# Patient Record
Sex: Female | Born: 1965 | Race: Black or African American | Hispanic: No | Marital: Married | Smoking: Never smoker
Health system: Southern US, Community
[De-identification: ages and names within clinical notes are randomized; demographics above are authoritative.]

## PROBLEM LIST (undated history)

## (undated) DIAGNOSIS — Z5189 Encounter for other specified aftercare: Secondary | ICD-10-CM

## (undated) DIAGNOSIS — D219 Benign neoplasm of connective and other soft tissue, unspecified: Secondary | ICD-10-CM

## (undated) DIAGNOSIS — D649 Anemia, unspecified: Secondary | ICD-10-CM

## (undated) DIAGNOSIS — N3010 Interstitial cystitis (chronic) without hematuria: Secondary | ICD-10-CM

## (undated) DIAGNOSIS — T7840XA Allergy, unspecified, initial encounter: Secondary | ICD-10-CM

## (undated) HISTORY — PX: UTERINE FIBROID SURGERY: SHX826

## (undated) HISTORY — PX: MYOMECTOMY: SHX85

## (undated) HISTORY — DX: Encounter for other specified aftercare: Z51.89

## (undated) HISTORY — DX: Allergy, unspecified, initial encounter: T78.40XA

---

## 1998-08-06 ENCOUNTER — Encounter: Admission: RE | Admit: 1998-08-06 | Discharge: 1998-08-06 | Payer: Self-pay | Admitting: *Deleted

## 1999-10-24 ENCOUNTER — Encounter: Payer: Self-pay | Admitting: Obstetrics & Gynecology

## 1999-10-24 ENCOUNTER — Inpatient Hospital Stay (HOSPITAL_COMMUNITY): Admission: AD | Admit: 1999-10-24 | Discharge: 1999-10-24 | Payer: Self-pay | Admitting: Obstetrics & Gynecology

## 1999-11-13 ENCOUNTER — Encounter: Admission: RE | Admit: 1999-11-13 | Discharge: 1999-11-13 | Payer: Self-pay | Admitting: Obstetrics

## 2000-05-06 ENCOUNTER — Encounter: Admission: RE | Admit: 2000-05-06 | Discharge: 2000-05-06 | Payer: Self-pay | Admitting: Obstetrics

## 2000-12-23 ENCOUNTER — Encounter: Admission: RE | Admit: 2000-12-23 | Discharge: 2000-12-23 | Payer: Self-pay | Admitting: Obstetrics

## 2001-02-03 ENCOUNTER — Encounter: Admission: RE | Admit: 2001-02-03 | Discharge: 2001-02-03 | Payer: Self-pay | Admitting: Obstetrics

## 2001-06-21 ENCOUNTER — Encounter: Payer: Self-pay | Admitting: *Deleted

## 2001-06-21 ENCOUNTER — Inpatient Hospital Stay (HOSPITAL_COMMUNITY): Admission: AD | Admit: 2001-06-21 | Discharge: 2001-06-21 | Payer: Self-pay | Admitting: *Deleted

## 2001-12-13 ENCOUNTER — Encounter: Admission: RE | Admit: 2001-12-13 | Discharge: 2001-12-13 | Payer: Self-pay | Admitting: Obstetrics

## 2003-05-17 ENCOUNTER — Ambulatory Visit (HOSPITAL_COMMUNITY): Admission: RE | Admit: 2003-05-17 | Discharge: 2003-05-17 | Payer: Self-pay | Admitting: Obstetrics & Gynecology

## 2003-05-17 ENCOUNTER — Encounter: Payer: Self-pay | Admitting: Obstetrics & Gynecology

## 2003-07-31 ENCOUNTER — Ambulatory Visit (HOSPITAL_COMMUNITY): Admission: RE | Admit: 2003-07-31 | Discharge: 2003-07-31 | Payer: Self-pay | Admitting: Obstetrics & Gynecology

## 2003-07-31 ENCOUNTER — Encounter: Payer: Self-pay | Admitting: Obstetrics & Gynecology

## 2004-02-27 ENCOUNTER — Other Ambulatory Visit: Admission: RE | Admit: 2004-02-27 | Discharge: 2004-02-27 | Payer: Self-pay | Admitting: Gynecology

## 2004-07-31 ENCOUNTER — Encounter (INDEPENDENT_AMBULATORY_CARE_PROVIDER_SITE_OTHER): Payer: Self-pay | Admitting: Specialist

## 2004-07-31 ENCOUNTER — Observation Stay (HOSPITAL_COMMUNITY): Admission: RE | Admit: 2004-07-31 | Discharge: 2004-08-01 | Payer: Self-pay | Admitting: Gynecology

## 2005-04-09 ENCOUNTER — Other Ambulatory Visit: Admission: RE | Admit: 2005-04-09 | Discharge: 2005-04-09 | Payer: Self-pay | Admitting: Gynecology

## 2005-05-19 ENCOUNTER — Other Ambulatory Visit: Admission: RE | Admit: 2005-05-19 | Discharge: 2005-05-19 | Payer: Self-pay | Admitting: Gynecology

## 2006-04-08 ENCOUNTER — Emergency Department (HOSPITAL_COMMUNITY): Admission: EM | Admit: 2006-04-08 | Discharge: 2006-04-09 | Payer: Self-pay | Admitting: Emergency Medicine

## 2007-08-23 ENCOUNTER — Encounter: Admission: RE | Admit: 2007-08-23 | Discharge: 2007-08-23 | Payer: Self-pay | Admitting: Family Medicine

## 2008-01-11 ENCOUNTER — Other Ambulatory Visit: Admission: RE | Admit: 2008-01-11 | Discharge: 2008-01-11 | Payer: Self-pay | Admitting: Gynecology

## 2011-04-10 NOTE — Op Note (Signed)
NAME:  Teresa Preston, Teresa Preston                          ACCOUNT NO.:  [id number]   MEDICAL RECORD NO.:  [id number]                   PATIENT TYPE:  OBV   LOCATION:  0098                                 FACILITY:  [facility]   PHYSICIAN:  Leatha Gilding. Mezer, M.D.               DATE OF BIRTH:  26-Aug-1966   DATE OF PROCEDURE:  07/31/2004  DATE OF DISCHARGE:                                 OPERATIVE REPORT   PREOPERATIVE DIAGNOSES:  Fibroid uterus.   POSTOPERATIVE DIAGNOSES:  Fibroid uterus.   OPERATION PERFORMED:  Abdominal myomectomy.   SURGEON:  Leatha Gilding. Mezer, M.D.   ASSISTANT:  Almedia Balls. Randell Patient, M.D.   ANESTHESIA:  General endotracheal.   PREPARATION:  Betadine.   DESCRIPTION OF PROCEDURE:  With the patient in the supine position, she was  prepped and draped in the routine fashion.  A Pfannenstiel incision was made  through the skin and subcutaneous tissue. The fascia and peritoneum were  opened without difficulty. Exploration of her upper abdomen was benign.  The  appendix was normal. Exploration of the pelvis revealed the uterus to be 14-  16 weeks in size with a large bulge on the posterior aspect of the uterus.  The ovaries were normal and both tubes were occluded distally. There was a  complete occlusion of the distal tubes and the scarring appeared to be quite  chronic but not salvageable. The uterus was elevated. A dilute solution of  Pitressin was injected in the posterior wall of the uterus and the  myometrium incised. The fibroid was quite deep in the myometrium and the  dissection plans were terrible.  Very meticulous dissection was performed in  particular on the bottom half of the equator of the fibroid.  Even though  the dissection was taken as close to the fibroid as possible with very good  traction and counter traction, there was still a significant defect made in  the wall of the endometrium.  The pediatric Foley that had been placed was  deflated and removed.  The  myometrium was closed in multiple layers using #0  chromic suture taking care not to enter the endometrium with the sutures and  trying to restore the cavity as much as possible. The myometrium was built  up very nicely with multiple layers and the serosa was approximated with a  baseball stitch of 3-0 PDS.  Hemostasis was completely intact.  The pelvis  was irrigated with copious amounts of warm lactated Ringer's solution, the  pelvis reinspected, the suture line again found to be completely dry. At the  completion of the procedure, an effort was made to place the large bowel in  the cul-de-sac, the omentum was brought down, the abdomen was closed in  layers using a running 2-0 Vicryl on the peritoneum, running #0 Vicryl at  the midline bilaterally on the fascia. Hemostasis was assured in the  subcutaneous tissue and the  skin was closed with staples. The estimated  blood loss was 200 mL.  Sponge, needle and instrument counts were correct  x2.  The patient tolerated the procedure well and was taken to the recovery  room in satisfactory condition.                                              Leatha Gilding. Mezer, M.D.   HCM/MEDQ  D:  07/31/2004  T:  07/31/2004  Job:  [id number]

## 2011-04-10 NOTE — H&P (Signed)
NAME:  Teresa Preston, Teresa Preston                          ACCOUNT NO.:  [id number]   MEDICAL RECORD NO.:  [id number]                   PATIENT TYPE:  AMB   LOCATION:  DAY                                  FACILITY:  [facility]   PHYSICIAN:  Howard C. Mezer, M.D.               DATE OF BIRTH:  05/07/1966   DATE OF ADMISSION:  07/31/2004  DATE OF DISCHARGE:                                HISTORY & PHYSICAL   ADMISSION DIAGNOSIS:  Fibroid uterus.   The patient is a 45 year old gravida 1, AB 1, female, admitted with last  menstrual period on July 22, 2004, with a fibroid uterus, for abdominal  myomectomy.  The patient is attempting to achieve pregnancy and on  hysterosalpingogram had a distorted uterine cavity with a small fill of the  right tube and no fill of the left tube.  On examination there appears to be  a large fibroid with the uterus extending almost to the umbilicus.  The  options have been discussed in detail with the patient, including no surgery  and possible adoption and myomectomy.  Limitations for becoming pregnant  after myomectomy have been stressed on multiple visits, as we have no  assessment of the fallopian tubes and the potential for adhesions at this  time or postoperative adhesions secondary to the myomectomy.  Myomectomy has  been discussed in detail with the patient and her husband, and potential  complications including but not limited to anesthesia; injury to the bowel,  bladder, and ureters; possible blood loss with transfusion and its sequelae;  and possible infection in the pelvis or in the wound; the possible need for  hysterectomy at the time of surgery if there was excessive bleeding or not  enough uterus to properly reconstruct the uterus after the myomectomy.  Absolutely no guarantee has been given to become pregnant or the status of  the fallopian tubes.  The patient understands that almost for sure there  will be a need for cesarean section should she become  pregnant and a  potential for uterine rupture that could be catastrophic for the baby and/or  the mother.  Possible postoperative adhesions and their consequences have  been discussed.  Postoperative expectations and restrictions have been  reviewed in detail.  The patient understands that she needs to use perfect  contraception for six months postoperatively.  Pain control has been  discussed.  The possible need for a transfusion has been stressed, given the  size of the fibroid and low hemoglobin before the procedure.  The patient  has asked very extensive questions and appears to have a very good  understanding of the procedure and the possible alternatives.   PAST MEDICAL HISTORY:  Surgical:  D&C.   Medical:  None.   MEDICATIONS:  Iron and vitamins, no herbs or supplements.   ALLERGIES:  BACTRIM.   Smokes:  None.  ETOH:  None.   SOCIAL HISTORY:  The patient is married and works for [employer].   FAMILY HISTORY:  Positive for colon cancer and uterine cancer.   PHYSICAL EXAMINATION:  HEENT:  Negative.  CHEST:  The lungs are clear.  CARDIAC:  Without murmurs.  BREASTS:  Without masses or discharge.  ABDOMEN:  Obese, soft, and nontender, with a mass below the umbilicus.  PELVIC:  Vagina and cervix normal.  The uterus palpates to just below the  umbilicus.  The adnexa were without palpable masses.  EXTREMITIES:  Negative.   IMPRESSION:  1.  Fibroid uterus.  2.  Obesity.   PLAN:  Abdominal myomectomy                                               Howard C. Mezer, M.D.    HCM/MEDQ  D:  07/31/2004  T:  07/31/2004  Job:  [id number]

## 2011-10-16 ENCOUNTER — Other Ambulatory Visit: Payer: Self-pay | Admitting: Gynecology

## 2011-10-16 DIAGNOSIS — R928 Other abnormal and inconclusive findings on diagnostic imaging of breast: Secondary | ICD-10-CM

## 2011-11-11 ENCOUNTER — Ambulatory Visit
Admission: RE | Admit: 2011-11-11 | Discharge: 2011-11-11 | Disposition: A | Discharge status: Home / Self Care | Payer: 59 | Source: Ambulatory Visit | Attending: Gynecology | Admitting: Gynecology

## 2011-11-11 DIAGNOSIS — R928 Other abnormal and inconclusive findings on diagnostic imaging of breast: Secondary | ICD-10-CM

## 2012-02-22 ENCOUNTER — Other Ambulatory Visit: Payer: Self-pay | Admitting: Gynecology

## 2012-02-22 DIAGNOSIS — R921 Mammographic calcification found on diagnostic imaging of breast: Secondary | ICD-10-CM

## 2012-04-27 ENCOUNTER — Ambulatory Visit
Admission: RE | Admit: 2012-04-27 | Discharge: 2012-04-27 | Disposition: A | Discharge status: Home / Self Care | Payer: 59 | Source: Ambulatory Visit | Attending: Gynecology | Admitting: Gynecology

## 2012-04-27 DIAGNOSIS — R921 Mammographic calcification found on diagnostic imaging of breast: Secondary | ICD-10-CM

## 2012-10-19 ENCOUNTER — Other Ambulatory Visit: Payer: Self-pay | Admitting: Gynecology

## 2012-10-19 DIAGNOSIS — R921 Mammographic calcification found on diagnostic imaging of breast: Secondary | ICD-10-CM

## 2012-11-01 ENCOUNTER — Ambulatory Visit
Admission: RE | Admit: 2012-11-01 | Discharge: 2012-11-01 | Disposition: A | Discharge status: Home / Self Care | Payer: 59 | Source: Ambulatory Visit | Attending: Gynecology | Admitting: Gynecology

## 2012-11-01 DIAGNOSIS — R921 Mammographic calcification found on diagnostic imaging of breast: Secondary | ICD-10-CM

## 2013-05-09 ENCOUNTER — Emergency Department (HOSPITAL_COMMUNITY)
Admission: EM | Admit: 2013-05-09 | Discharge: 2013-05-09 | Disposition: A | Discharge status: Home / Self Care | Payer: 59 | Source: Home / Self Care

## 2013-05-09 ENCOUNTER — Encounter (HOSPITAL_COMMUNITY): Payer: Self-pay | Admitting: Emergency Medicine

## 2013-05-09 DIAGNOSIS — K2970 Gastritis, unspecified, without bleeding: Secondary | ICD-10-CM

## 2013-05-09 DIAGNOSIS — R1013 Epigastric pain: Secondary | ICD-10-CM

## 2013-05-09 DIAGNOSIS — R52 Pain, unspecified: Secondary | ICD-10-CM

## 2013-05-09 MED ORDER — ONDANSETRON 4 MG PO TBDP
4.0000 mg | ORAL_TABLET | Freq: Once | ORAL | Status: AC
  Administered 2013-05-09: 4 mg via ORAL

## 2013-05-09 MED ORDER — ONDANSETRON 4 MG PO TBDP
ORAL_TABLET | ORAL | Status: AC
  Filled 2013-05-09: qty 1

## 2013-05-09 MED ORDER — GI COCKTAIL ~~LOC~~
ORAL | Status: AC
  Filled 2013-05-09: qty 30

## 2013-05-09 MED ORDER — PANTOPRAZOLE SODIUM 40 MG PO TBEC: 40.0000 mg | DELAYED_RELEASE_TABLET | Freq: Every day | ORAL | Status: DC

## 2013-05-09 MED ORDER — ONDANSETRON HCL 4 MG PO TABS: 4.0000 mg | ORAL_TABLET | Freq: Four times a day (QID) | ORAL | Status: DC

## 2013-05-09 MED ORDER — GI COCKTAIL ~~LOC~~
30.0000 mL | Freq: Once | ORAL | Status: AC
  Administered 2013-05-09: 30 mL via ORAL

## 2013-05-09 NOTE — ED Notes (Signed)
Reports "severe " stomach pain, onset Sunday.  Info per information sheet filled out by patient.

## 2013-05-09 NOTE — ED Provider Notes (Signed)
Medical screening examination/treatment/procedure(s) were performed by resident physician or non-physician practitioner and as supervising physician I was immediately available for consultation/collaboration.   Halen Mossbarger DOUGLAS MD.   Kenyatte Gruber D Regina Coppolino, MD 05/09/13 1645 

## 2013-05-09 NOTE — ED Notes (Signed)
DELAY IN TRIAGE SECONDARY TO ASSIGNMENT

## 2013-05-09 NOTE — ED Provider Notes (Signed)
History     CSN: [id number]  Arrival date & time 05/09/13  1454   None     No chief complaint on file.   (Consider location/radiation/quality/duration/timing/severity/associated sxs/prior treatment) HPI Comments: 47 y o F c/o mod to severe abdominal pain 48 hours ago. Yesterday all abated, no pain. This AM the pain reoccurred . Located directly over the epigastrium. St is a nauseating sharp type pain. No vomiting, fever, chills, C or D. Denies blood in stool. No hx of abdominal surgery.    No past medical history on file.  No past surgical history on file.  No family history on file.  History  Substance Use Topics  . Smoking status: Not on file  . Smokeless tobacco: Not on file  . Alcohol Use: Not on file    OB History   No data available      Review of Systems  Constitutional: Positive for activity change and appetite change. Negative for fever and chills.  HENT: Negative.   Respiratory: Negative.   Cardiovascular: Negative.   Gastrointestinal: Positive for nausea and abdominal pain. Negative for vomiting, constipation, blood in stool, abdominal distention and rectal pain.  Genitourinary: Negative.   Psychiatric/Behavioral: Negative.     Allergies  Review of patient's allergies indicates not on file.  Home Medications   Current Outpatient Rx  Name  Route  Sig  Dispense  Refill  . ondansetron (ZOFRAN) 4 MG tablet   Oral   Take 1 tablet (4 mg total) by mouth every 6 (six) hours.   12 tablet   0   . pantoprazole (PROTONIX) 40 MG tablet   Oral   Take 1 tablet (40 mg total) by mouth daily.   20 tablet   0     BP 135/82  Pulse 96  Temp(Src) 97.9 F (36.6 C) (Oral)  Resp 16  SpO2 100%  Physical Exam  Nursing note and vitals reviewed. Constitutional: She is oriented to person, place, and time. She appears well-developed and well-nourished. No distress.  Neck: Normal range of motion. Neck supple.  Cardiovascular: Normal rate, regular rhythm and  normal heart sounds.   Pulmonary/Chest: Effort normal and breath sounds normal. No respiratory distress.  Abdominal: Soft. She exhibits no distension and no mass. There is tenderness. There is no rebound and no guarding.  Tenderness directly over the high epigastrium. No tenderness in the left or right upper or lower quadrants.  Musculoskeletal: She exhibits no edema.  Neurological: She is alert and oriented to person, place, and time. She exhibits normal muscle tone. Coordination normal.  Skin: Skin is warm and dry.  Psychiatric: She has a normal mood and affect.    ED Course  Procedures (including critical care time)  Labs Reviewed - No data to display No results found.   1. Gastritis   2. Acute epigastric pain       MDM  Diff Dx includes viral etio; Helicobacter, PUD,  At this time the patient does not have an acute abdomen. She does have tenderness directly over the epigastrium. Treat with Protonix 40 mg by mouth daily and Zofran 4 mg by mouth every 6 hours when necessary nausea. She developed vomiting, increased pain, bleeding or other symptoms correctly to the emergency department promptly. Diet should be without greasy or spicy foods. Primarily start with maintaining clear liquids and broth. Call your gastroenterologist tomorrow for appointment. Zofran and Gi cocktail po now        Hayden Rasmussen, NP 05/09/13 1551

## 2013-10-17 ENCOUNTER — Other Ambulatory Visit: Payer: Self-pay

## 2013-10-17 DIAGNOSIS — Z1231 Encounter for screening mammogram for malignant neoplasm of breast: Secondary | ICD-10-CM

## 2013-11-28 ENCOUNTER — Ambulatory Visit: Payer: 59

## 2013-12-08 ENCOUNTER — Other Ambulatory Visit: Payer: Self-pay | Admitting: Gynecology

## 2013-12-08 DIAGNOSIS — N920 Excessive and frequent menstruation with regular cycle: Secondary | ICD-10-CM

## 2013-12-08 DIAGNOSIS — D259 Leiomyoma of uterus, unspecified: Secondary | ICD-10-CM

## 2013-12-19 ENCOUNTER — Ambulatory Visit
Admission: RE | Admit: 2013-12-19 | Discharge: 2013-12-19 | Disposition: A | Discharge status: Home / Self Care | Payer: 59 | Source: Ambulatory Visit | Attending: Gynecology | Admitting: Gynecology

## 2013-12-19 DIAGNOSIS — N920 Excessive and frequent menstruation with regular cycle: Secondary | ICD-10-CM

## 2013-12-19 DIAGNOSIS — D259 Leiomyoma of uterus, unspecified: Secondary | ICD-10-CM

## 2013-12-19 HISTORY — DX: Benign neoplasm of connective and other soft tissue, unspecified: D21.9

## 2013-12-19 HISTORY — DX: Interstitial cystitis (chronic) without hematuria: N30.10

## 2014-01-05 ENCOUNTER — Ambulatory Visit
Admission: RE | Admit: 2014-01-05 | Discharge: 2014-01-05 | Disposition: A | Discharge status: Home / Self Care | Payer: 59 | Source: Ambulatory Visit | Attending: Gynecology | Admitting: Gynecology

## 2014-01-05 DIAGNOSIS — N920 Excessive and frequent menstruation with regular cycle: Secondary | ICD-10-CM

## 2014-01-05 DIAGNOSIS — D259 Leiomyoma of uterus, unspecified: Secondary | ICD-10-CM

## 2014-01-05 MED ORDER — GADOBENATE DIMEGLUMINE 529 MG/ML IV SOLN
15.0000 mL | Freq: Once | INTRAVENOUS | Status: AC | PRN
  Administered 2014-01-05: 15 mL via INTRAVENOUS

## 2014-01-09 ENCOUNTER — Telehealth: Payer: Self-pay | Admitting: Emergency Medicine

## 2014-01-09 NOTE — Telephone Encounter (Signed)
Message copied by Janith Lima on Tue Jan 09, 2014 12:38 PM ------      Message from: HOSS, ART A      Created: Tue Jan 09, 2014  8:22 AM      Regarding: RE: MRI RESULTS       I have reviewed the MRI and she is a candidate without reservation. Please set up [place] pending results of endometrial Bx.            [place]            ----- Message -----         From: Janith Lima, EMT         Sent: 01/08/2014   9:16 AM           To: Cleaster Corin, MD      Subject: FW: MRI RESULTS                                          PLEASE REVIEW AND ADVISE ON MRI, PT WANTS THESE RESULTS PRIOR TO SCHEDULING EBX!            [facility]      ----- Message -----         From: Janith Lima, EMT         Sent: 12/27/2013   3:58 PM           To: Janith Lima, EMT, Henri Jamse Mead, RN      Subject: MRI RESULTS                                              PT HAVING MRI ON 01-05-14 AT 230PM.  HAVE DR HOSS TO REVIEW AND ADVISE ON [place].  IF MRI GOOD, THEN PT NEEDS TO HAVE THE EBX.             ------

## 2014-01-09 NOTE — Telephone Encounter (Signed)
CALLED PT TO MAKE HER AWARE OF THE MR RESULTS- I WILL CONTACT DR MEZER'S OFFICE TO SET HER UP FOR AN EBX. PENDING THE RESULTS THEN WE CAN SET HER UP FOR [place].   12:49PM = LMOVM FOR LISA, RN AT DR Carren Rang OFFICE TO SET UP EBX.

## 2014-02-13 ENCOUNTER — Telehealth: Payer: Self-pay | Admitting: Emergency Medicine

## 2014-02-13 NOTE — Telephone Encounter (Signed)
CALLED PT TO MAKE HER AWARE THAT WE HAVE RECEIVED HER EBX RESULTS AND SHE IS GOOD TO GO FOR [place]. WE WILL CK WITH HER INSURANCE TO GET [employer] AND WILL HAVE TIFFANY W/ [facility] TO CALL HER TO SET UP APPT.

## 2014-02-15 ENCOUNTER — Telehealth: Payer: Self-pay | Admitting: Emergency Medicine

## 2014-02-15 NOTE — Telephone Encounter (Signed)
LMOVM TO MAKE PT AWARE THAT [employer] HAS APPROVED HER [place]. [facility] SHOULD CONTACT HER DIRECTLY TO SET UP PROCEDURE.

## 2014-02-20 ENCOUNTER — Other Ambulatory Visit: Payer: Self-pay | Admitting: Radiology

## 2014-02-20 DIAGNOSIS — D259 Leiomyoma of uterus, unspecified: Secondary | ICD-10-CM

## 2014-02-27 ENCOUNTER — Encounter (HOSPITAL_COMMUNITY): Payer: Self-pay | Admitting: Pharmacy Technician

## 2014-03-05 ENCOUNTER — Other Ambulatory Visit: Payer: Self-pay | Admitting: Radiology

## 2014-03-07 ENCOUNTER — Ambulatory Visit (HOSPITAL_COMMUNITY)
Admission: RE | Admit: 2014-03-07 | Discharge: 2014-03-07 | Disposition: A | Discharge status: Home / Self Care | Payer: 59 | Source: Ambulatory Visit | Attending: Interventional Radiology | Admitting: Interventional Radiology

## 2014-03-07 ENCOUNTER — Encounter (HOSPITAL_COMMUNITY): Payer: Self-pay

## 2014-03-07 ENCOUNTER — Observation Stay (HOSPITAL_COMMUNITY)
Admission: RE | Admit: 2014-03-07 | Discharge: 2014-03-08 | Disposition: A | Discharge status: Home / Self Care | Payer: 59 | Source: Ambulatory Visit | Attending: Interventional Radiology | Admitting: Interventional Radiology

## 2014-03-07 VITALS — BP 139/91 | HR 81 | Temp 97.2°F | Resp 14 | Ht 60.0 in | Wt 169.0 lb

## 2014-03-07 VITALS — BP 119/71 | HR 98 | Temp 98.3°F | Resp 15 | Ht 60.0 in | Wt 169.0 lb

## 2014-03-07 DIAGNOSIS — D259 Leiomyoma of uterus, unspecified: Secondary | ICD-10-CM | POA: Diagnosis present

## 2014-03-07 DIAGNOSIS — D649 Anemia, unspecified: Secondary | ICD-10-CM | POA: Insufficient documentation

## 2014-03-07 DIAGNOSIS — D219 Benign neoplasm of connective and other soft tissue, unspecified: Secondary | ICD-10-CM | POA: Diagnosis present

## 2014-03-07 DIAGNOSIS — N920 Excessive and frequent menstruation with regular cycle: Secondary | ICD-10-CM | POA: Insufficient documentation

## 2014-03-07 HISTORY — DX: Anemia, unspecified: D64.9

## 2014-03-07 LAB — CBC WITH DIFFERENTIAL/PLATELET
Basophils Absolute: 0 10*3/uL (ref 0.0–0.1)
Basophils Relative: 0 % (ref 0–1)
EOS PCT: 3 % (ref 0–5)
Eosinophils Absolute: 0.2 10*3/uL (ref 0.0–0.7)
HCT: 27.9 % — ABNORMAL LOW (ref 36.0–46.0)
Hemoglobin: 8.4 g/dL — ABNORMAL LOW (ref 12.0–15.0)
LYMPHS ABS: 3 10*3/uL (ref 0.7–4.0)
LYMPHS PCT: 36 % (ref 12–46)
MCH: 23.4 pg — ABNORMAL LOW (ref 26.0–34.0)
MCHC: 30.1 g/dL (ref 30.0–36.0)
MCV: 77.7 fL — AB (ref 78.0–100.0)
Monocytes Absolute: 0.6 10*3/uL (ref 0.1–1.0)
Monocytes Relative: 7 % (ref 3–12)
NEUTROS ABS: 4.4 10*3/uL (ref 1.7–7.7)
Neutrophils Relative %: 54 % (ref 43–77)
PLATELETS: 394 10*3/uL (ref 150–400)
RBC: 3.59 MIL/uL — AB (ref 3.87–5.11)
RDW: 16.6 % — ABNORMAL HIGH (ref 11.5–15.5)
WBC: 8.2 10*3/uL (ref 4.0–10.5)

## 2014-03-07 LAB — PROTIME-INR
INR: 0.92 (ref 0.00–1.49)
Prothrombin Time: 12.2 seconds (ref 11.6–15.2)

## 2014-03-07 LAB — BASIC METABOLIC PANEL
BUN: 12 mg/dL (ref 6–23)
CALCIUM: 9 mg/dL (ref 8.4–10.5)
CHLORIDE: 107 meq/L (ref 96–112)
CO2: 22 meq/L (ref 19–32)
Creatinine, Ser: 0.76 mg/dL (ref 0.50–1.10)
GFR calc non Af Amer: >90 mL/min (ref >90)
Glucose, Bld: 91 mg/dL (ref 70–99)
Potassium: 4.4 mEq/L (ref 3.7–5.3)
SODIUM: 141 meq/L (ref 137–147)

## 2014-03-07 LAB — APTT: APTT: 25 s (ref 24–37)

## 2014-03-07 LAB — HCG, SERUM, QUALITATIVE: PREG SERUM: NEGATIVE

## 2014-03-07 MED ORDER — CEFAZOLIN SODIUM-DEXTROSE 2-3 GM-% IV SOLR
INTRAVENOUS | Status: AC
  Filled 2014-03-07: qty 50

## 2014-03-07 MED ORDER — CEFAZOLIN SODIUM-DEXTROSE 2-3 GM-% IV SOLR
2.0000 g | Freq: Once | INTRAVENOUS | Status: AC
  Administered 2014-03-07: 2 g via INTRAVENOUS

## 2014-03-07 MED ORDER — SODIUM CHLORIDE 0.9 % IJ SOLN: 9.0000 mL | INTRAMUSCULAR | Status: DC | PRN

## 2014-03-07 MED ORDER — KETOROLAC TROMETHAMINE 30 MG/ML IJ SOLN
INTRAMUSCULAR | Status: AC
  Filled 2014-03-07: qty 1

## 2014-03-07 MED ORDER — DIPHENHYDRAMINE HCL 12.5 MG/5ML PO ELIX
12.5000 mg | ORAL_SOLUTION | Freq: Four times a day (QID) | ORAL | Status: DC | PRN
  Filled 2014-03-07: qty 5

## 2014-03-07 MED ORDER — NALOXONE HCL 0.4 MG/ML IJ SOLN: 0.4000 mg | INTRAMUSCULAR | Status: DC | PRN

## 2014-03-07 MED ORDER — SODIUM CHLORIDE 0.9 % IJ SOLN
3.0000 mL | Freq: Two times a day (BID) | INTRAMUSCULAR | Status: DC
  Administered 2014-03-08: 3 mL via INTRAVENOUS

## 2014-03-07 MED ORDER — DIPHENHYDRAMINE HCL 12.5 MG/5ML PO ELIX: 12.5000 mg | ORAL_SOLUTION | Freq: Four times a day (QID) | ORAL | Status: DC | PRN

## 2014-03-07 MED ORDER — SODIUM CHLORIDE 0.9 % IV SOLN: 250.0000 mL | INTRAVENOUS | Status: DC | PRN

## 2014-03-07 MED ORDER — MIDAZOLAM HCL 2 MG/2ML IJ SOLN
INTRAMUSCULAR | Status: AC
  Filled 2014-03-07: qty 6

## 2014-03-07 MED ORDER — DIPHENHYDRAMINE HCL 50 MG/ML IJ SOLN
12.5000 mg | Freq: Four times a day (QID) | INTRAMUSCULAR | Status: DC | PRN
  Administered 2014-03-07: 12.5 mg via INTRAVENOUS

## 2014-03-07 MED ORDER — PROMETHAZINE HCL 25 MG RE SUPP: 25.0000 mg | Freq: Three times a day (TID) | RECTAL | Status: DC | PRN

## 2014-03-07 MED ORDER — IOHEXOL 300 MG/ML  SOLN
80.0000 mL | Freq: Once | INTRAMUSCULAR | Status: AC | PRN
  Administered 2014-03-07: 98 mL

## 2014-03-07 MED ORDER — PROMETHAZINE HCL 25 MG PO TABS
25.0000 mg | ORAL_TABLET | Freq: Three times a day (TID) | ORAL | Status: DC | PRN
  Administered 2014-03-07: 25 mg via ORAL
  Filled 2014-03-07: qty 1

## 2014-03-07 MED ORDER — HYDROMORPHONE HCL PF 1 MG/ML IJ SOLN
INTRAMUSCULAR | Status: AC | PRN
  Administered 2014-03-07: 0.5 mg via INTRAVENOUS
  Administered 2014-03-07: 1 mg via INTRAVENOUS
  Administered 2014-03-07: 0.5 mg via INTRAVENOUS

## 2014-03-07 MED ORDER — SODIUM CHLORIDE 0.9 % IJ SOLN: 3.0000 mL | INTRAMUSCULAR | Status: DC | PRN

## 2014-03-07 MED ORDER — DIPHENHYDRAMINE HCL 50 MG/ML IJ SOLN
12.5000 mg | Freq: Four times a day (QID) | INTRAMUSCULAR | Status: DC | PRN
  Filled 2014-03-07: qty 1

## 2014-03-07 MED ORDER — DOCUSATE SODIUM 100 MG PO CAPS
100.0000 mg | ORAL_CAPSULE | Freq: Two times a day (BID) | ORAL | Status: DC
  Administered 2014-03-07 – 2014-03-08 (×2): 100 mg via ORAL
  Filled 2014-03-07 (×3): qty 1

## 2014-03-07 MED ORDER — HYDROMORPHONE HCL PF 2 MG/ML IJ SOLN
INTRAMUSCULAR | Status: AC
  Filled 2014-03-07: qty 2

## 2014-03-07 MED ORDER — HYDROMORPHONE 0.3 MG/ML IV SOLN
INTRAVENOUS | Status: DC
Start: 2014-03-07 — End: 2014-03-08
  Filled 2014-03-07: qty 25

## 2014-03-07 MED ORDER — ONDANSETRON HCL 4 MG/2ML IJ SOLN
4.0000 mg | Freq: Four times a day (QID) | INTRAMUSCULAR | Status: DC | PRN
  Filled 2014-03-07: qty 2

## 2014-03-07 MED ORDER — SODIUM CHLORIDE 0.9 % IV SOLN
INTRAVENOUS | Status: DC
  Administered 2014-03-07 (×2): via INTRAVENOUS

## 2014-03-07 MED ORDER — HYDROMORPHONE 0.3 MG/ML IV SOLN
INTRAVENOUS | Status: DC
  Administered 2014-03-07: 0.9 mg via INTRAVENOUS
  Administered 2014-03-08: 2.7 mg via INTRAVENOUS
  Administered 2014-03-08: 1.2 mg via INTRAVENOUS
  Administered 2014-03-08: 0.3 mg via INTRAVENOUS
  Filled 2014-03-07: qty 25

## 2014-03-07 MED ORDER — ONDANSETRON HCL 4 MG/2ML IJ SOLN
4.0000 mg | Freq: Four times a day (QID) | INTRAMUSCULAR | Status: DC | PRN
  Administered 2014-03-07: 4 mg via INTRAVENOUS

## 2014-03-07 MED ORDER — HYDROMORPHONE 0.3 MG/ML IV SOLN
INTRAVENOUS | Status: DC
  Administered 2014-03-07: 1.1 mg via INTRAVENOUS
  Administered 2014-03-07: 2.6 mg via INTRAVENOUS
  Administered 2014-03-07: 12:00:00 via INTRAVENOUS
  Administered 2014-03-08: 2.5 mg via INTRAVENOUS

## 2014-03-07 MED ORDER — FENTANYL CITRATE 0.05 MG/ML IJ SOLN
INTRAMUSCULAR | Status: AC | PRN
  Administered 2014-03-07: 25 ug via INTRAVENOUS
  Administered 2014-03-07: 50 ug via INTRAVENOUS
  Administered 2014-03-07: 25 ug via INTRAVENOUS

## 2014-03-07 MED ORDER — MIDAZOLAM HCL 2 MG/2ML IJ SOLN
INTRAMUSCULAR | Status: AC | PRN
  Administered 2014-03-07 (×2): 1 mg via INTRAVENOUS
  Administered 2014-03-07: 0.5 mg via INTRAVENOUS
  Administered 2014-03-07 (×2): 1 mg via INTRAVENOUS
  Administered 2014-03-07: 0.5 mg via INTRAVENOUS
  Administered 2014-03-07: 1 mg via INTRAVENOUS

## 2014-03-07 MED ORDER — KETOROLAC TROMETHAMINE 30 MG/ML IJ SOLN
30.0000 mg | Freq: Once | INTRAMUSCULAR | Status: AC
  Administered 2014-03-07: 30 mg via INTRAVENOUS

## 2014-03-07 MED ORDER — LIDOCAINE HCL 1 % IJ SOLN
INTRAMUSCULAR | Status: AC
  Filled 2014-03-07: qty 20

## 2014-03-07 MED ORDER — FENTANYL CITRATE 0.05 MG/ML IJ SOLN
INTRAMUSCULAR | Status: AC
  Filled 2014-03-07: qty 6

## 2014-03-07 NOTE — Progress Notes (Signed)
Subjective: Pt c/o intermittent nausea, pelvic cramping; drowsy from dilaudid  Objective: Vital signs in last 24 hours: Temp:  [97.2 F (36.2 C)-98.7 F (37.1 C)] 98.7 F (37.1 C) (04/15 1608) Pulse Rate:  [78-98] 91 (04/15 1608) Resp:  [12-24] 15 (04/15 1617) BP: (107-149)/(60-97) 138/91 mmHg (04/15 1608) SpO2:  [97 %-100 %] 100 % (04/15 1617) Weight:  [169 lb (76.658 kg)] 169 lb (76.658 kg) (04/15 1500) Last BM Date: 03/07/14  Intake/Output from previous day:   Intake/Output this shift: Total I/O In: 120 [P.O.:120] Out: -   Rt groin soft, no hematoma, NT; intact distal pulses; mild pelvic tenderness; foley draining clear yellow urine  Lab Results:   Recent Labs  03/07/14 0755  WBC 8.2  HGB 8.4*  HCT 27.9*  PLT 394   BMET  Recent Labs  03/07/14 0755  NA 141  K 4.4  CL 107  CO2 22  GLUCOSE 91  BUN 12  CREATININE 0.76  CALCIUM 9.0   PT/INR  Recent Labs  03/07/14 0755  LABPROT 12.2  INR 0.92   ABG No results found for this basename: PHART, PCO2, PO2, HCO3,  in the last 72 hours  Studies/Results: Ir Angiogram Pelvis Selective Or Supraselective  03/07/2014   CLINICAL DATA:  Uterine fibroids  EXAM: UTERINE ARTERY EMBOLIZATION  FLUOROSCOPY TIME:  Nineteen minutes  MEDICATIONS AND MEDICAL HISTORY: Versed 6 mg, Fentanyl 100 mcg.  Additional Medications:  Toradol 30 mg. Dilaudid 2 mg.  As antibiotic prophylaxis, Ancef was ordered pre-procedure and administered intravenously within one hour of incision.  Allergies: Bactrim  ANESTHESIA/SEDATION: Moderate sedation time: 100 minutes  CONTRAST:  80 cc Omnipaque 300  PROCEDURE: Vessels selected: Third order ipsilateral and contralateral iliac artery.  The procedure, risks, benefits, and alternatives were explained to the patient. Questions regarding the procedure were encouraged and answered. The patient understands and consents to the procedure.  The right groin was prepped with Betadine in a sterile fashion, and a  sterile drape was applied covering the operative field. A sterile gown and sterile gloves were used for the procedure.  Under sonographic guidance, a micropuncture needle was inserted into the right common femoral artery and removed over a 018 wire which was up sized to a Bentson. A 5-French sheath was inserted. Cobra II catheter was advanced into the aorta. The contralateral left common iliac artery was selected. The internal iliac artery on the left was selected. 3D angiography was performed. A micro catheter was advanced over an 018 glide wire into the left uterine artery. Angiography was performed.  Embolization was performed utilizing2 vials 500-700 micron embospheres and 2/3 vial 700-900 micron embospheres.  The micro catheter was removed and flushed. A Waltman's loop was created. The ipsilateral right common iliac artery and right internal iliac artery were selected. 3D angiography was performed. A micro catheter was advanced over a 0018 glide wire into the right uterine artery. Angiography was performed.  Embolization was again performed with2 vials 500-700 micron embospheres.  The micro catheter and Cobra catheter were removed. Right femoral angiography was performed. A Exoseal device was deployed without complication and hemostasis was achieved.  FINDINGS: Imaging demonstrates bilateral pelvic anatomy and bilateral uterine artery angiography. Expected anatomy is identified. No blood supply from the uterine artery to the vagina is visualized.  Post embolization angiography demonstrates relative stasis of flow and pruning of the vasculature to the uterus.  COMPLICATIONS: None  IMPRESSION: Successful bilateral uterine artery embolization.   Electronically Signed   By: Maryclare Bean  M.D.   On: 03/07/2014 13:27   Ir Angiogram Pelvis Selective Or Supraselective  03/07/2014   CLINICAL DATA:  Uterine fibroids  EXAM: UTERINE ARTERY EMBOLIZATION  FLUOROSCOPY TIME:  Nineteen minutes  MEDICATIONS AND MEDICAL HISTORY:  Versed 6 mg, Fentanyl 100 mcg.  Additional Medications:  Toradol 30 mg. Dilaudid 2 mg.  As antibiotic prophylaxis, Ancef was ordered pre-procedure and administered intravenously within one hour of incision.  Allergies: Bactrim  ANESTHESIA/SEDATION: Moderate sedation time: 100 minutes  CONTRAST:  80 cc Omnipaque 300  PROCEDURE: Vessels selected: Third order ipsilateral and contralateral iliac artery.  The procedure, risks, benefits, and alternatives were explained to the patient. Questions regarding the procedure were encouraged and answered. The patient understands and consents to the procedure.  The right groin was prepped with Betadine in a sterile fashion, and a sterile drape was applied covering the operative field. A sterile gown and sterile gloves were used for the procedure.  Under sonographic guidance, a micropuncture needle was inserted into the right common femoral artery and removed over a 018 wire which was up sized to a Bentson. A 5-French sheath was inserted. Cobra II catheter was advanced into the aorta. The contralateral left common iliac artery was selected. The internal iliac artery on the left was selected. 3D angiography was performed. A micro catheter was advanced over an 018 glide wire into the left uterine artery. Angiography was performed.  Embolization was performed utilizing2 vials 500-700 micron embospheres and 2/3 vial 700-900 micron embospheres.  The micro catheter was removed and flushed. A Waltman's loop was created. The ipsilateral right common iliac artery and right internal iliac artery were selected. 3D angiography was performed. A micro catheter was advanced over a 0018 glide wire into the right uterine artery. Angiography was performed.  Embolization was again performed with2 vials 500-700 micron embospheres.  The micro catheter and Cobra catheter were removed. Right femoral angiography was performed. A Exoseal device was deployed without complication and hemostasis was achieved.   FINDINGS: Imaging demonstrates bilateral pelvic anatomy and bilateral uterine artery angiography. Expected anatomy is identified. No blood supply from the uterine artery to the vagina is visualized.  Post embolization angiography demonstrates relative stasis of flow and pruning of the vasculature to the uterus.  COMPLICATIONS: None  IMPRESSION: Successful bilateral uterine artery embolization.   Electronically Signed   By: Maryclare Bean M.D.   On: 03/07/2014 13:27   Ir Angiogram Selective Each Additional Vessel  03/07/2014   CLINICAL DATA:  Uterine fibroids  EXAM: UTERINE ARTERY EMBOLIZATION  FLUOROSCOPY TIME:  Nineteen minutes  MEDICATIONS AND MEDICAL HISTORY: Versed 6 mg, Fentanyl 100 mcg.  Additional Medications:  Toradol 30 mg. Dilaudid 2 mg.  As antibiotic prophylaxis, Ancef was ordered pre-procedure and administered intravenously within one hour of incision.  Allergies: Bactrim  ANESTHESIA/SEDATION: Moderate sedation time: 100 minutes  CONTRAST:  80 cc Omnipaque 300  PROCEDURE: Vessels selected: Third order ipsilateral and contralateral iliac artery.  The procedure, risks, benefits, and alternatives were explained to the patient. Questions regarding the procedure were encouraged and answered. The patient understands and consents to the procedure.  The right groin was prepped with Betadine in a sterile fashion, and a sterile drape was applied covering the operative field. A sterile gown and sterile gloves were used for the procedure.  Under sonographic guidance, a micropuncture needle was inserted into the right common femoral artery and removed over a 018 wire which was up sized to a Bentson. A 5-French sheath was inserted. Cobra II  catheter was advanced into the aorta. The contralateral left common iliac artery was selected. The internal iliac artery on the left was selected. 3D angiography was performed. A micro catheter was advanced over an 018 glide wire into the left uterine artery. Angiography was  performed.  Embolization was performed utilizing2 vials 500-700 micron embospheres and 2/3 vial 700-900 micron embospheres.  The micro catheter was removed and flushed. A Waltman's loop was created. The ipsilateral right common iliac artery and right internal iliac artery were selected. 3D angiography was performed. A micro catheter was advanced over a 0018 glide wire into the right uterine artery. Angiography was performed.  Embolization was again performed with2 vials 500-700 micron embospheres.  The micro catheter and Cobra catheter were removed. Right femoral angiography was performed. A Exoseal device was deployed without complication and hemostasis was achieved.  FINDINGS: Imaging demonstrates bilateral pelvic anatomy and bilateral uterine artery angiography. Expected anatomy is identified. No blood supply from the uterine artery to the vagina is visualized.  Post embolization angiography demonstrates relative stasis of flow and pruning of the vasculature to the uterus.  COMPLICATIONS: None  IMPRESSION: Successful bilateral uterine artery embolization.   Electronically Signed   By: Maryclare Bean M.D.   On: 03/07/2014 13:27   Ir Angiogram Selective Each Additional Vessel  03/07/2014   CLINICAL DATA:  Uterine fibroids  EXAM: UTERINE ARTERY EMBOLIZATION  FLUOROSCOPY TIME:  Nineteen minutes  MEDICATIONS AND MEDICAL HISTORY: Versed 6 mg, Fentanyl 100 mcg.  Additional Medications:  Toradol 30 mg. Dilaudid 2 mg.  As antibiotic prophylaxis, Ancef was ordered pre-procedure and administered intravenously within one hour of incision.  Allergies: Bactrim  ANESTHESIA/SEDATION: Moderate sedation time: 100 minutes  CONTRAST:  80 cc Omnipaque 300  PROCEDURE: Vessels selected: Third order ipsilateral and contralateral iliac artery.  The procedure, risks, benefits, and alternatives were explained to the patient. Questions regarding the procedure were encouraged and answered. The patient understands and consents to the procedure.   The right groin was prepped with Betadine in a sterile fashion, and a sterile drape was applied covering the operative field. A sterile gown and sterile gloves were used for the procedure.  Under sonographic guidance, a micropuncture needle was inserted into the right common femoral artery and removed over a 018 wire which was up sized to a Bentson. A 5-French sheath was inserted. Cobra II catheter was advanced into the aorta. The contralateral left common iliac artery was selected. The internal iliac artery on the left was selected. 3D angiography was performed. A micro catheter was advanced over an 018 glide wire into the left uterine artery. Angiography was performed.  Embolization was performed utilizing2 vials 500-700 micron embospheres and 2/3 vial 700-900 micron embospheres.  The micro catheter was removed and flushed. A Waltman's loop was created. The ipsilateral right common iliac artery and right internal iliac artery were selected. 3D angiography was performed. A micro catheter was advanced over a 0018 glide wire into the right uterine artery. Angiography was performed.  Embolization was again performed with2 vials 500-700 micron embospheres.  The micro catheter and Cobra catheter were removed. Right femoral angiography was performed. A Exoseal device was deployed without complication and hemostasis was achieved.  FINDINGS: Imaging demonstrates bilateral pelvic anatomy and bilateral uterine artery angiography. Expected anatomy is identified. No blood supply from the uterine artery to the vagina is visualized.  Post embolization angiography demonstrates relative stasis of flow and pruning of the vasculature to the uterus.  COMPLICATIONS: None  IMPRESSION: Successful bilateral  uterine artery embolization.   Electronically Signed   By: Maryclare Bean M.D.   On: 03/07/2014 13:27   Ir US Guide Vasc Access Right  03/07/2014   CLINICAL DATA:  Uterine fibroids  EXAM: UTERINE ARTERY EMBOLIZATION  FLUOROSCOPY TIME:   Nineteen minutes  MEDICATIONS AND MEDICAL HISTORY: Versed 6 mg, Fentanyl 100 mcg.  Additional Medications:  Toradol 30 mg. Dilaudid 2 mg.  As antibiotic prophylaxis, Ancef was ordered pre-procedure and administered intravenously within one hour of incision.  Allergies: Bactrim  ANESTHESIA/SEDATION: Moderate sedation time: 100 minutes  CONTRAST:  80 cc Omnipaque 300  PROCEDURE: Vessels selected: Third order ipsilateral and contralateral iliac artery.  The procedure, risks, benefits, and alternatives were explained to the patient. Questions regarding the procedure were encouraged and answered. The patient understands and consents to the procedure.  The right groin was prepped with Betadine in a sterile fashion, and a sterile drape was applied covering the operative field. A sterile gown and sterile gloves were used for the procedure.  Under sonographic guidance, a micropuncture needle was inserted into the right common femoral artery and removed over a 018 wire which was up sized to a Bentson. A 5-French sheath was inserted. Cobra II catheter was advanced into the aorta. The contralateral left common iliac artery was selected. The internal iliac artery on the left was selected. 3D angiography was performed. A micro catheter was advanced over an 018 glide wire into the left uterine artery. Angiography was performed.  Embolization was performed utilizing2 vials 500-700 micron embospheres and 2/3 vial 700-900 micron embospheres.  The micro catheter was removed and flushed. A Waltman's loop was created. The ipsilateral right common iliac artery and right internal iliac artery were selected. 3D angiography was performed. A micro catheter was advanced over a 0018 glide wire into the right uterine artery. Angiography was performed.  Embolization was again performed with2 vials 500-700 micron embospheres.  The micro catheter and Cobra catheter were removed. Right femoral angiography was performed. A Exoseal device was  deployed without complication and hemostasis was achieved.  FINDINGS: Imaging demonstrates bilateral pelvic anatomy and bilateral uterine artery angiography. Expected anatomy is identified. No blood supply from the uterine artery to the vagina is visualized.  Post embolization angiography demonstrates relative stasis of flow and pruning of the vasculature to the uterus.  COMPLICATIONS: None  IMPRESSION: Successful bilateral uterine artery embolization.   Electronically Signed   By: Maryclare Bean M.D.   On: 03/07/2014 13:27   Ir Embo Tumor Organ Ischemia Infarct Inc Guide Roadmapping  03/07/2014   CLINICAL DATA:  Uterine fibroids  EXAM: UTERINE ARTERY EMBOLIZATION  FLUOROSCOPY TIME:  Nineteen minutes  MEDICATIONS AND MEDICAL HISTORY: Versed 6 mg, Fentanyl 100 mcg.  Additional Medications:  Toradol 30 mg. Dilaudid 2 mg.  As antibiotic prophylaxis, Ancef was ordered pre-procedure and administered intravenously within one hour of incision.  Allergies: Bactrim  ANESTHESIA/SEDATION: Moderate sedation time: 100 minutes  CONTRAST:  80 cc Omnipaque 300  PROCEDURE: Vessels selected: Third order ipsilateral and contralateral iliac artery.  The procedure, risks, benefits, and alternatives were explained to the patient. Questions regarding the procedure were encouraged and answered. The patient understands and consents to the procedure.  The right groin was prepped with Betadine in a sterile fashion, and a sterile drape was applied covering the operative field. A sterile gown and sterile gloves were used for the procedure.  Under sonographic guidance, a micropuncture needle was inserted into the right common femoral artery and removed over a  018 wire which was up sized to a Bentson. A 5-French sheath was inserted. Cobra II catheter was advanced into the aorta. The contralateral left common iliac artery was selected. The internal iliac artery on the left was selected. 3D angiography was performed. A micro catheter was advanced  over an 018 glide wire into the left uterine artery. Angiography was performed.  Embolization was performed utilizing2 vials 500-700 micron embospheres and 2/3 vial 700-900 micron embospheres.  The micro catheter was removed and flushed. A Waltman's loop was created. The ipsilateral right common iliac artery and right internal iliac artery were selected. 3D angiography was performed. A micro catheter was advanced over a 0018 glide wire into the right uterine artery. Angiography was performed.  Embolization was again performed with2 vials 500-700 micron embospheres.  The micro catheter and Cobra catheter were removed. Right femoral angiography was performed. A Exoseal device was deployed without complication and hemostasis was achieved.  FINDINGS: Imaging demonstrates bilateral pelvic anatomy and bilateral uterine artery angiography. Expected anatomy is identified. No blood supply from the uterine artery to the vagina is visualized.  Post embolization angiography demonstrates relative stasis of flow and pruning of the vasculature to the uterus.  COMPLICATIONS: None  IMPRESSION: Successful bilateral uterine artery embolization.   Electronically Signed   By: Maryclare Bean M.D.   On: 03/07/2014 13:27    Anti-infectives: Anti-infectives   None      Assessment/Plan: s/p bilat [place] secondary to symptomatic uterine fibroids 4/15;overnight obs for pain control; hydrate, dilaudid PCA for pain; zofran for nausea; d/c foley; f/u with Dr. Barbie Banner in [place] [facility] in 2-3 weeks.  LOS: 0 days    D Rowe Robert 03/07/2014

## 2014-03-07 NOTE — Sedation Documentation (Signed)
5 Fr sheath removed from R femoral artery by Dr. Barbie Banner.  Hemostasis achieved using Exoseal device.  R groin level 0, 4+ RDP.

## 2014-03-07 NOTE — Procedures (Signed)
B [facility] No comp 

## 2014-03-07 NOTE — H&P (Signed)
Teresa Preston is an 48 y.o. female.   Chief Complaint: uterine fibroids, menorrhagia HPI: Patient with long history of uterine fibroids and prior myomectomy in 2005 ; now with severe menorrhagia, anemia and recent MRI pelvis revealing multiple uterine fibroids. She presents today following IR consultation for elective bilateral uterine artery embolization.  Past Medical History  Diagnosis Date  . Fibroids   . Cystitis, interstitial   . Anemia     Past Surgical History  Procedure Laterality Date  . Myomectomy      History reviewed. No pertinent family history. Social History:  reports that she has never smoked. She does not have any smokeless tobacco history on file. She reports that she does not drink alcohol or use illicit drugs.  Allergies:  Allergies  Allergen Reactions  . Bactrim [Sulfamethoxazole-Trimethoprim] Rash    Hands peeled    Current outpatient prescriptions:Phenyleph-Doxylamine-DM-APAP (ALKA SELTZER PLUS PO), Take 1 tablet by mouth every 8 (eight) hours as needed (cold symptoms)., Disp: , Rfl: ;  pseudoephedrine (SUDAFED) 30 MG tablet, Take 30 mg by mouth every 4 (four) hours as needed for congestion., Disp: , Rfl:  Current facility-administered medications:0.9 %  sodium chloride infusion, , Intravenous, Continuous, Ascencion Dike, [name], Last Rate: 75 mL/hr at 03/07/14 0802;  ceFAZolin (ANCEF) IVPB 2 g/50 mL premix, 2 g, Intravenous, Once, [employer], [name];  iohexol (OMNIPAQUE) 300 MG/ML solution 80 mL, 80 mL, Other, Once PRN, Medication Radiologist, MD;  ketorolac (TORADOL) 30 MG/ML injection 30 mg, 30 mg, Intravenous, Once, Ascencion Dike, [name]    Results for orders placed during the hospital encounter of 03/07/14  APTT      Result Value Ref Range   aPTT 25  24 - 37 seconds  BASIC METABOLIC PANEL      Result Value Ref Range   Sodium 141  137 - 147 mEq/L   Potassium 4.4  3.7 - 5.3 mEq/L   Chloride 107  96 - 112 mEq/L   CO2 22  19 - 32 mEq/L   Glucose, Bld 91   70 - 99 mg/dL   BUN 12  6 - 23 mg/dL   Creatinine, Ser 0.76  0.50 - 1.10 mg/dL   Calcium 9.0  8.4 - 10.5 mg/dL   GFR calc non Af Amer >90  >90 mL/min   GFR calc Af Amer >90  >90 mL/min  CBC WITH DIFFERENTIAL      Result Value Ref Range   WBC 8.2  4.0 - 10.5 K/uL   RBC 3.59 (*) 3.87 - 5.11 MIL/uL   Hemoglobin 8.4 (*) 12.0 - 15.0 g/dL   HCT 27.9 (*) 36.0 - 46.0 %   MCV 77.7 (*) 78.0 - 100.0 fL   MCH 23.4 (*) 26.0 - 34.0 pg   MCHC 30.1  30.0 - 36.0 g/dL   RDW 16.6 (*) 11.5 - 15.5 %   Platelets 394  150 - 400 K/uL   Neutrophils Relative % 54  43 - 77 %   Neutro Abs 4.4  1.7 - 7.7 K/uL   Lymphocytes Relative 36  12 - 46 %   Lymphs Abs 3.0  0.7 - 4.0 K/uL   Monocytes Relative 7  3 - 12 %   Monocytes Absolute 0.6  0.1 - 1.0 K/uL   Eosinophils Relative 3  0 - 5 %   Eosinophils Absolute 0.2  0.0 - 0.7 K/uL   Basophils Relative 0  0 - 1 %   Basophils Absolute 0.0  0.0 - 0.1 K/uL  HCG, SERUM, QUALITATIVE      Result Value Ref Range   Preg, Serum NEGATIVE  NEGATIVE  PROTIME-INR      Result Value Ref Range   Prothrombin Time 12.2  11.6 - 15.2 seconds   INR 0.92  0.00 - 1.49    Review of Systems  Constitutional: Negative for fever and chills.  Respiratory: Negative for hemoptysis and shortness of breath.        Occ cough  Cardiovascular: Negative for chest pain.  Gastrointestinal: Negative for nausea, vomiting, abdominal pain and blood in stool.  Genitourinary: Negative for urgency, frequency, hematuria and flank pain.  Musculoskeletal: Negative for back pain.  Neurological: Negative for headaches.  Endo/Heme/Allergies:       Menorrhagia  Psychiatric/Behavioral: The patient is nervous/anxious.     Blood pressure 125/82, pulse 88, temperature 97.2 F (36.2 C), temperature source Oral, resp. rate 20, height 5' (1.524 m), weight 169 lb (76.658 kg), SpO2 98.00%. Physical Exam  Constitutional: She is oriented to person, place, and time. She appears well-developed and well-nourished.   Cardiovascular: Normal rate and regular rhythm.   Respiratory: Effort normal and breath sounds normal.  GI: Soft. Bowel sounds are normal. There is no tenderness.  Fibroid uterus  Musculoskeletal: Normal range of motion. She exhibits no edema.  Neurological: She is alert and oriented to person, place, and time.     Assessment/Plan Patient with long history of uterine fibroids and prior myomectomy in 2005 ; now with severe menorrhagia, anemia and recent MRI pelvis revealing multiple uterine fibroids. She presents today following IR consultation for elective bilateral uterine artery embolization. Details/risks of procedure d/w pt/family with their understanding and consent.  Crissie Sickles Christin Moline 03/07/2014, 8:38 AM

## 2014-03-07 NOTE — Discharge Instructions (Signed)
Uterine Artery Embolization for Fibroids Uterine artery embolization is a nonsurgical treatment to shrink fibroids. A thin plastic tube (catheter) is used to inject material that blocks off the blood supply to the fibroid, which causes the fibroid to shrink. LET [facility] CARE PROVIDER KNOW ABOUT:  Any allergies you have.  All medicines you are taking, including vitamins, herbs, eye drops, creams, and over-the-counter medicines.  Previous problems you or members of your family have had with the use of anesthetics.  Any blood disorders you have.  Previous surgeries you have had.  Medical conditions you have. RISKS AND COMPLICATIONS  Injury to the uterus from decreased blood supply  Infection.  Blood infection (septicemia).  Lack of menstrual periods (amenorrhea).  Death of tissue cells (necrosis) around your bladder or vulva.  Development of a hole between organs or from an organ to the surface of your skin (fistula).  Blood clot in the legs (deep vein thrombosis) or lung (pulmonary embolus). BEFORE THE PROCEDURE  Ask your health care provider about changing or stopping your regular medicines.   Do not take aspirin or blood thinners (anticoagulants) for 1 week before the surgery or as directed by your health care provider.  Do not eat or drink anything for 8 hours before the surgery or as directed by your health care provider.   Empty your bladder before the procedure begins. PROCEDURE   An IV tube will be placed into one of your veins. This will be used to give you a sedative and pain medication (conscious sedation).  You will be given a medicine that numbs the area (local anesthetic).  A small cut will be made in your groin. A catheter is then inserted into the main artery of your leg.  The catheter will be guided through the artery to your uterus. A series of images will be taken while dye is injected through the catheter in your groin. X-rays are taken at the  same time. This is done to provide a road map of the blood supply to your uterus and fibroids.  Tiny plastic spheres, about the size of sand grains, will be injected through the catheter. Metal coils may be used to help block the artery. The particles will lodge in tiny branches of the uterine artery that supplies blood to the fibroids.  The procedure is repeated on the artery that supplies the other side of the uterus.  The catheter is then removed and pressure is held to stop any bleeding. No stitches are needed.  A dressing is then placed over the cut (incision). AFTER THE PROCEDURE  You will be taken to a recovery area where your progress will be monitored until you are awake, stable, and taking fluids well. If there are no other problems, you will then be moved to a regular hospital room.  You will be observed overnight in the hospital.  You will have cramping that should be controlled with pain medication. Document Released: 01/25/2006 Document Revised: 08/30/2013 Document Reviewed: 05/25/2013 [facility] Patient Information 2014 [place], [place].

## 2014-03-08 ENCOUNTER — Other Ambulatory Visit: Payer: Self-pay | Admitting: Radiology

## 2014-03-08 DIAGNOSIS — D219 Benign neoplasm of connective and other soft tissue, unspecified: Secondary | ICD-10-CM

## 2014-03-08 MED ORDER — HYDROCODONE-ACETAMINOPHEN 5-325 MG PO TABS
1.0000 | ORAL_TABLET | ORAL | Status: DC | PRN
  Administered 2014-03-08 (×2): 1 via ORAL
  Filled 2014-03-08: qty 1
  Filled 2014-03-08: qty 2

## 2014-03-08 MED ORDER — ONDANSETRON HCL 4 MG PO TABS: 4.0000 mg | ORAL_TABLET | Freq: Four times a day (QID) | ORAL | Status: DC | PRN

## 2014-03-08 MED ORDER — IBUPROFEN 600 MG PO TABS
600.0000 mg | ORAL_TABLET | Freq: Three times a day (TID) | ORAL | Status: DC
Start: 2014-03-08 — End: 2015-02-14

## 2014-03-08 MED ORDER — HYDROCODONE-ACETAMINOPHEN 5-325 MG PO TABS: 1.0000 | ORAL_TABLET | ORAL | Status: DC | PRN

## 2014-03-08 NOTE — Discharge Instructions (Signed)
Uterine Artery Embolization for Fibroids °Uterine artery embolization is a nonsurgical treatment to shrink fibroids. A thin plastic tube (catheter) is used to inject material that blocks off the blood supply to the fibroid, which causes the fibroid to shrink. °LET YOUR HEALTH CARE PROVIDER KNOW ABOUT: °· Any allergies you have. °· All medicines you are taking, including vitamins, herbs, eye drops, creams, and over-the-counter medicines. °· Previous problems you or members of your family have had with the use of anesthetics. °· Any blood disorders you have. °· Previous surgeries you have had. °· Medical conditions you have. °RISKS AND COMPLICATIONS °· Injury to the uterus from decreased blood supply °· Infection. °· Blood infection (septicemia). °· Lack of menstrual periods (amenorrhea). °· Death of tissue cells (necrosis) around your bladder or vulva. °· Development of a hole between organs or from an organ to the surface of your skin (fistula). °· Blood clot in the legs (deep vein thrombosis) or lung (pulmonary embolus). °BEFORE THE PROCEDURE °· Ask your health care provider about changing or stopping your regular medicines.   °· Do not take aspirin or blood thinners (anticoagulants) for 1 week before the surgery or as directed by your health care provider. °· Do not eat or drink anything for 8 hours before the surgery or as directed by your health care provider.   °· Empty your bladder before the procedure begins. °PROCEDURE  °· An IV tube will be placed into one of your veins. This will be used to give you a sedative and pain medication (conscious sedation). °· You will be given a medicine that numbs the area (local anesthetic). °· A small cut will be made in your groin. A catheter is then inserted into the main artery of your leg. °· The catheter will be guided through the artery to your uterus. A series of images will be taken while dye is injected through the catheter in your groin. X-rays are taken at the  same time. This is done to provide a road map of the blood supply to your uterus and fibroids. °· Tiny plastic spheres, about the size of sand grains, will be injected through the catheter. Metal coils may be used to help block the artery. The particles will lodge in tiny branches of the uterine artery that supplies blood to the fibroids. °· The procedure is repeated on the artery that supplies the other side of the uterus. °· The catheter is then removed and pressure is held to stop any bleeding. No stitches are needed. °· A dressing is then placed over the cut (incision). °AFTER THE PROCEDURE °· You will be taken to a recovery area where your progress will be monitored until you are awake, stable, and taking fluids well. If there are no other problems, you will then be moved to a regular hospital room. °· You will be observed overnight in the hospital. °· You will have cramping that should be controlled with pain medication. °Document Released: 01/25/2006 Document Revised: 08/30/2013 Document Reviewed: 05/25/2013 °[employer]® Patient Information ©2014 [employer]. ° °

## 2014-03-08 NOTE — Discharge Summary (Signed)
Physician Discharge Summary  Patient ID: Teresa Preston MRN: [id number] DOB/AGE: Jan 15, 1966 48 y.o.  Admit date: 03/07/2014 Discharge date: 03/08/2014  Admission Diagnoses: Active Problems:   Fibroids  Discharge Diagnoses:  Active Problems:   Fibroids    Procedures: Bilateral uterine artery embolization via right femoral approach, 03/07/14  Discharged Condition: good  Hospital Course: HPI: Patient with long history of uterine fibroids and prior myomectomy in 2005 ; now with severe menorrhagia, anemia and recent MRI pelvis revealing multiple uterine fibroids. She presents following IR consultation for elective bilateral uterine artery embolization She was brought to IR suite and with moderate sedation, underwent successful bilateral uterine artery embolization. No immediate complications. She was admitted to the floor in stable condition. Her foley was removed about 6 hours after procedure and she has been able to void.  Her pain has been controlled with PCA Dilaudid and she did have some N/V post procedure. By POD #1, she felt much better. Nausea resolved and pain much improved. The PCA was discontinued and she has tolerated po pain meds. Vital signs have been stable, the patient has tolerated regular diet and has ambulated well without difficulty. She is stable for discharge. All home instructions, meds, and follow up plans were reviewed with the patient and her husband with good understanding.   Consults: None   Discharge Exam: Blood pressure 119/71, pulse 98, temperature 98.3 F (36.8 C), temperature source Oral, resp. rate 16, height 5' (1.524 m), weight 169 lb (76.658 kg), SpO2 97.00%.    Disposition: 01-Home or Self Care  Discharge Orders   Future Orders Complete By Expires   Call MD for:  difficulty breathing, headache or visual disturbances  As directed    Call MD for:  persistant nausea and vomiting  As directed    Call MD for:  redness, tenderness, or signs of  infection (pain, swelling, redness, odor or green/yellow discharge around incision site)  As directed    Call MD for:  severe uncontrolled pain  As directed    Call MD for:  temperature >100.4  As directed    Diet - low sodium heart healthy  As directed    Driving Restrictions  As directed    Increase activity slowly  As directed    Lifting restrictions  As directed    May shower / Bathe  As directed    May walk up steps  As directed    Remove dressing in 24 hours  As directed        Medication List         ALKA SELTZER PLUS PO  Take 1 tablet by mouth every 8 (eight) hours as needed (cold symptoms).     HYDROcodone-acetaminophen 5-325 MG per tablet  Commonly known as:  NORCO/VICODIN  Take 1-2 tablets by mouth every 4 (four) hours as needed for moderate pain.     ibuprofen 600 MG tablet  Commonly known as:  ADVIL,MOTRIN  Take 1 tablet (600 mg total) by mouth 3 (three) times daily with meals.     ondansetron 4 MG tablet  Commonly known as:  ZOFRAN  Take 1 tablet (4 mg total) by mouth every 6 (six) hours as needed for nausea or vomiting.     pseudoephedrine 30 MG tablet  Commonly known as:  SUDAFED  Take 30 mg by mouth every 4 (four) hours as needed for congestion.           Follow-up Information   Follow up with HOSS,ART A, MD.  Schedule an appointment as soon as possible for a visit in 2 weeks. (Tammy from office will call with appointment date/time. Call office with questions/concerns at [phone])    Specialty:  Interventional Radiology   Contact information:   [address] [place] [place] [address] [phone]       Signed: Ascencion Dike [name] 03/08/2014, 10:45 AM

## 2014-03-08 NOTE — Progress Notes (Signed)
Subjective: Pt feeling ok. Got some Benadryl for itching from pain meds and is still a bit drowsy this morning. She does feel much better compared to yesterday. Trying to eat some solids right now. Has been voiding on her own after foley removed.   Objective: Physical Exam: BP 119/71  Pulse 98  Temp(Src) 98.3 F (36.8 C) (Oral)  Resp 16  Ht 5' (1.524 m)  Wt 169 lb (76.658 kg)  BMI 33.01 kg/m2  SpO2 97% Lungs: CTA Heart: Reg Abd: soft, NT, ND Ext: Rt groin soft , NT, no hematoma. Dressing dry.  Feet warm, good pedal pulses.    Labs: CBC  Recent Labs  03/07/14 0755  WBC 8.2  HGB 8.4*  HCT 27.9*  PLT 394   BMET  Recent Labs  03/07/14 0755  NA 141  K 4.4  CL 107  CO2 22  GLUCOSE 91  BUN 12  CREATININE 0.76  CALCIUM 9.0   LFT No results found for this basename: PROT, ALBUMIN, AST, ALT, ALKPHOS, BILITOT, BILIDIR, IBILI, LIPASE,  in the last 72 hours PT/INR  Recent Labs  03/07/14 0755  LABPROT 12.2  INR 0.92     Studies/Results: Ir Angiogram Pelvis Selective Or Supraselective  03/07/2014   CLINICAL DATA:  Uterine fibroids  EXAM: UTERINE ARTERY EMBOLIZATION  FLUOROSCOPY TIME:  Nineteen minutes  MEDICATIONS AND MEDICAL HISTORY: Versed 6 mg, Fentanyl 100 mcg.  Additional Medications:  Toradol 30 mg. Dilaudid 2 mg.  As antibiotic prophylaxis, Ancef was ordered pre-procedure and administered intravenously within one hour of incision.  Allergies: Bactrim  ANESTHESIA/SEDATION: Moderate sedation time: 100 minutes  CONTRAST:  80 cc Omnipaque 300  PROCEDURE: Vessels selected: Third order ipsilateral and contralateral iliac artery.  The procedure, risks, benefits, and alternatives were explained to the patient. Questions regarding the procedure were encouraged and answered. The patient understands and consents to the procedure.  The right groin was prepped with Betadine in a sterile fashion, and a sterile drape was applied covering the operative field. A sterile gown and  sterile gloves were used for the procedure.  Under sonographic guidance, a micropuncture needle was inserted into the right common femoral artery and removed over a 018 wire which was up sized to a Bentson. A 5-French sheath was inserted. Cobra II catheter was advanced into the aorta. The contralateral left common iliac artery was selected. The internal iliac artery on the left was selected. 3D angiography was performed. A micro catheter was advanced over an 018 glide wire into the left uterine artery. Angiography was performed.  Embolization was performed utilizing2 vials 500-700 micron embospheres and 2/3 vial 700-900 micron embospheres.  The micro catheter was removed and flushed. A Waltman's loop was created. The ipsilateral right common iliac artery and right internal iliac artery were selected. 3D angiography was performed. A micro catheter was advanced over a 0018 glide wire into the right uterine artery. Angiography was performed.  Embolization was again performed with2 vials 500-700 micron embospheres.  The micro catheter and Cobra catheter were removed. Right femoral angiography was performed. A Exoseal device was deployed without complication and hemostasis was achieved.  FINDINGS: Imaging demonstrates bilateral pelvic anatomy and bilateral uterine artery angiography. Expected anatomy is identified. No blood supply from the uterine artery to the vagina is visualized.  Post embolization angiography demonstrates relative stasis of flow and pruning of the vasculature to the uterus.  COMPLICATIONS: None  IMPRESSION: Successful bilateral uterine artery embolization.   Electronically Signed   By: Maryclare Bean  M.D.   On: 03/07/2014 13:27   Ir Angiogram Pelvis Selective Or Supraselective  03/07/2014   CLINICAL DATA:  Uterine fibroids  EXAM: UTERINE ARTERY EMBOLIZATION  FLUOROSCOPY TIME:  Nineteen minutes  MEDICATIONS AND MEDICAL HISTORY: Versed 6 mg, Fentanyl 100 mcg.  Additional Medications:  Toradol 30 mg.  Dilaudid 2 mg.  As antibiotic prophylaxis, Ancef was ordered pre-procedure and administered intravenously within one hour of incision.  Allergies: Bactrim  ANESTHESIA/SEDATION: Moderate sedation time: 100 minutes  CONTRAST:  80 cc Omnipaque 300  PROCEDURE: Vessels selected: Third order ipsilateral and contralateral iliac artery.  The procedure, risks, benefits, and alternatives were explained to the patient. Questions regarding the procedure were encouraged and answered. The patient understands and consents to the procedure.  The right groin was prepped with Betadine in a sterile fashion, and a sterile drape was applied covering the operative field. A sterile gown and sterile gloves were used for the procedure.  Under sonographic guidance, a micropuncture needle was inserted into the right common femoral artery and removed over a 018 wire which was up sized to a Bentson. A 5-French sheath was inserted. Cobra II catheter was advanced into the aorta. The contralateral left common iliac artery was selected. The internal iliac artery on the left was selected. 3D angiography was performed. A micro catheter was advanced over an 018 glide wire into the left uterine artery. Angiography was performed.  Embolization was performed utilizing2 vials 500-700 micron embospheres and 2/3 vial 700-900 micron embospheres.  The micro catheter was removed and flushed. A Waltman's loop was created. The ipsilateral right common iliac artery and right internal iliac artery were selected. 3D angiography was performed. A micro catheter was advanced over a 0018 glide wire into the right uterine artery. Angiography was performed.  Embolization was again performed with2 vials 500-700 micron embospheres.  The micro catheter and Cobra catheter were removed. Right femoral angiography was performed. A Exoseal device was deployed without complication and hemostasis was achieved.  FINDINGS: Imaging demonstrates bilateral pelvic anatomy and bilateral  uterine artery angiography. Expected anatomy is identified. No blood supply from the uterine artery to the vagina is visualized.  Post embolization angiography demonstrates relative stasis of flow and pruning of the vasculature to the uterus.  COMPLICATIONS: None  IMPRESSION: Successful bilateral uterine artery embolization.   Electronically Signed   By: Maryclare Bean M.D.   On: 03/07/2014 13:27   Ir Angiogram Selective Each Additional Vessel  03/07/2014   CLINICAL DATA:  Uterine fibroids  EXAM: UTERINE ARTERY EMBOLIZATION  FLUOROSCOPY TIME:  Nineteen minutes  MEDICATIONS AND MEDICAL HISTORY: Versed 6 mg, Fentanyl 100 mcg.  Additional Medications:  Toradol 30 mg. Dilaudid 2 mg.  As antibiotic prophylaxis, Ancef was ordered pre-procedure and administered intravenously within one hour of incision.  Allergies: Bactrim  ANESTHESIA/SEDATION: Moderate sedation time: 100 minutes  CONTRAST:  80 cc Omnipaque 300  PROCEDURE: Vessels selected: Third order ipsilateral and contralateral iliac artery.  The procedure, risks, benefits, and alternatives were explained to the patient. Questions regarding the procedure were encouraged and answered. The patient understands and consents to the procedure.  The right groin was prepped with Betadine in a sterile fashion, and a sterile drape was applied covering the operative field. A sterile gown and sterile gloves were used for the procedure.  Under sonographic guidance, a micropuncture needle was inserted into the right common femoral artery and removed over a 018 wire which was up sized to a Bentson. A 5-French sheath was inserted. Cobra II  catheter was advanced into the aorta. The contralateral left common iliac artery was selected. The internal iliac artery on the left was selected. 3D angiography was performed. A micro catheter was advanced over an 018 glide wire into the left uterine artery. Angiography was performed.  Embolization was performed utilizing2 vials 500-700 micron  embospheres and 2/3 vial 700-900 micron embospheres.  The micro catheter was removed and flushed. A Waltman's loop was created. The ipsilateral right common iliac artery and right internal iliac artery were selected. 3D angiography was performed. A micro catheter was advanced over a 0018 glide wire into the right uterine artery. Angiography was performed.  Embolization was again performed with2 vials 500-700 micron embospheres.  The micro catheter and Cobra catheter were removed. Right femoral angiography was performed. A Exoseal device was deployed without complication and hemostasis was achieved.  FINDINGS: Imaging demonstrates bilateral pelvic anatomy and bilateral uterine artery angiography. Expected anatomy is identified. No blood supply from the uterine artery to the vagina is visualized.  Post embolization angiography demonstrates relative stasis of flow and pruning of the vasculature to the uterus.  COMPLICATIONS: None  IMPRESSION: Successful bilateral uterine artery embolization.   Electronically Signed   By: Maryclare Bean M.D.   On: 03/07/2014 13:27   Ir Angiogram Selective Each Additional Vessel  03/07/2014   CLINICAL DATA:  Uterine fibroids  EXAM: UTERINE ARTERY EMBOLIZATION  FLUOROSCOPY TIME:  Nineteen minutes  MEDICATIONS AND MEDICAL HISTORY: Versed 6 mg, Fentanyl 100 mcg.  Additional Medications:  Toradol 30 mg. Dilaudid 2 mg.  As antibiotic prophylaxis, Ancef was ordered pre-procedure and administered intravenously within one hour of incision.  Allergies: Bactrim  ANESTHESIA/SEDATION: Moderate sedation time: 100 minutes  CONTRAST:  80 cc Omnipaque 300  PROCEDURE: Vessels selected: Third order ipsilateral and contralateral iliac artery.  The procedure, risks, benefits, and alternatives were explained to the patient. Questions regarding the procedure were encouraged and answered. The patient understands and consents to the procedure.  The right groin was prepped with Betadine in a sterile fashion, and a  sterile drape was applied covering the operative field. A sterile gown and sterile gloves were used for the procedure.  Under sonographic guidance, a micropuncture needle was inserted into the right common femoral artery and removed over a 018 wire which was up sized to a Bentson. A 5-French sheath was inserted. Cobra II catheter was advanced into the aorta. The contralateral left common iliac artery was selected. The internal iliac artery on the left was selected. 3D angiography was performed. A micro catheter was advanced over an 018 glide wire into the left uterine artery. Angiography was performed.  Embolization was performed utilizing2 vials 500-700 micron embospheres and 2/3 vial 700-900 micron embospheres.  The micro catheter was removed and flushed. A Waltman's loop was created. The ipsilateral right common iliac artery and right internal iliac artery were selected. 3D angiography was performed. A micro catheter was advanced over a 0018 glide wire into the right uterine artery. Angiography was performed.  Embolization was again performed with2 vials 500-700 micron embospheres.  The micro catheter and Cobra catheter were removed. Right femoral angiography was performed. A Exoseal device was deployed without complication and hemostasis was achieved.  FINDINGS: Imaging demonstrates bilateral pelvic anatomy and bilateral uterine artery angiography. Expected anatomy is identified. No blood supply from the uterine artery to the vagina is visualized.  Post embolization angiography demonstrates relative stasis of flow and pruning of the vasculature to the uterus.  COMPLICATIONS: None  IMPRESSION: Successful bilateral  uterine artery embolization.   Electronically Signed   By: Maryclare Bean M.D.   On: 03/07/2014 13:27   Ir US Guide Vasc Access Right  03/07/2014   CLINICAL DATA:  Uterine fibroids  EXAM: UTERINE ARTERY EMBOLIZATION  FLUOROSCOPY TIME:  Nineteen minutes  MEDICATIONS AND MEDICAL HISTORY: Versed 6 mg,  Fentanyl 100 mcg.  Additional Medications:  Toradol 30 mg. Dilaudid 2 mg.  As antibiotic prophylaxis, Ancef was ordered pre-procedure and administered intravenously within one hour of incision.  Allergies: Bactrim  ANESTHESIA/SEDATION: Moderate sedation time: 100 minutes  CONTRAST:  80 cc Omnipaque 300  PROCEDURE: Vessels selected: Third order ipsilateral and contralateral iliac artery.  The procedure, risks, benefits, and alternatives were explained to the patient. Questions regarding the procedure were encouraged and answered. The patient understands and consents to the procedure.  The right groin was prepped with Betadine in a sterile fashion, and a sterile drape was applied covering the operative field. A sterile gown and sterile gloves were used for the procedure.  Under sonographic guidance, a micropuncture needle was inserted into the right common femoral artery and removed over a 018 wire which was up sized to a Bentson. A 5-French sheath was inserted. Cobra II catheter was advanced into the aorta. The contralateral left common iliac artery was selected. The internal iliac artery on the left was selected. 3D angiography was performed. A micro catheter was advanced over an 018 glide wire into the left uterine artery. Angiography was performed.  Embolization was performed utilizing2 vials 500-700 micron embospheres and 2/3 vial 700-900 micron embospheres.  The micro catheter was removed and flushed. A Waltman's loop was created. The ipsilateral right common iliac artery and right internal iliac artery were selected. 3D angiography was performed. A micro catheter was advanced over a 0018 glide wire into the right uterine artery. Angiography was performed.  Embolization was again performed with2 vials 500-700 micron embospheres.  The micro catheter and Cobra catheter were removed. Right femoral angiography was performed. A Exoseal device was deployed without complication and hemostasis was achieved.  FINDINGS:  Imaging demonstrates bilateral pelvic anatomy and bilateral uterine artery angiography. Expected anatomy is identified. No blood supply from the uterine artery to the vagina is visualized.  Post embolization angiography demonstrates relative stasis of flow and pruning of the vasculature to the uterus.  COMPLICATIONS: None  IMPRESSION: Successful bilateral uterine artery embolization.   Electronically Signed   By: Maryclare Bean M.D.   On: 03/07/2014 13:27   Ir Embo Tumor Organ Ischemia Infarct Inc Guide Roadmapping  03/07/2014   CLINICAL DATA:  Uterine fibroids  EXAM: UTERINE ARTERY EMBOLIZATION  FLUOROSCOPY TIME:  Nineteen minutes  MEDICATIONS AND MEDICAL HISTORY: Versed 6 mg, Fentanyl 100 mcg.  Additional Medications:  Toradol 30 mg. Dilaudid 2 mg.  As antibiotic prophylaxis, Ancef was ordered pre-procedure and administered intravenously within one hour of incision.  Allergies: Bactrim  ANESTHESIA/SEDATION: Moderate sedation time: 100 minutes  CONTRAST:  80 cc Omnipaque 300  PROCEDURE: Vessels selected: Third order ipsilateral and contralateral iliac artery.  The procedure, risks, benefits, and alternatives were explained to the patient. Questions regarding the procedure were encouraged and answered. The patient understands and consents to the procedure.  The right groin was prepped with Betadine in a sterile fashion, and a sterile drape was applied covering the operative field. A sterile gown and sterile gloves were used for the procedure.  Under sonographic guidance, a micropuncture needle was inserted into the right common femoral artery and removed over a  018 wire which was up sized to a Bentson. A 5-French sheath was inserted. Cobra II catheter was advanced into the aorta. The contralateral left common iliac artery was selected. The internal iliac artery on the left was selected. 3D angiography was performed. A micro catheter was advanced over an 018 glide wire into the left uterine artery. Angiography was  performed.  Embolization was performed utilizing2 vials 500-700 micron embospheres and 2/3 vial 700-900 micron embospheres.  The micro catheter was removed and flushed. A Waltman's loop was created. The ipsilateral right common iliac artery and right internal iliac artery were selected. 3D angiography was performed. A micro catheter was advanced over a 0018 glide wire into the right uterine artery. Angiography was performed.  Embolization was again performed with2 vials 500-700 micron embospheres.  The micro catheter and Cobra catheter were removed. Right femoral angiography was performed. A Exoseal device was deployed without complication and hemostasis was achieved.  FINDINGS: Imaging demonstrates bilateral pelvic anatomy and bilateral uterine artery angiography. Expected anatomy is identified. No blood supply from the uterine artery to the vagina is visualized.  Post embolization angiography demonstrates relative stasis of flow and pruning of the vasculature to the uterus.  COMPLICATIONS: None  IMPRESSION: Successful bilateral uterine artery embolization.   Electronically Signed   By: Maryclare Bean M.D.   On: 03/07/2014 13:27    Assessment/Plan: S/p (B) uterine artery embolization for symptomatic fibroids. DC PCA OOB, reg diet. DC later this morning.    LOS: 1 day    Ascencion Dike [name] 03/08/2014 8:50 AM

## 2014-03-20 ENCOUNTER — Ambulatory Visit
Admission: RE | Admit: 2014-03-20 | Discharge: 2014-03-20 | Disposition: A | Discharge status: Home / Self Care | Payer: 59 | Source: Ambulatory Visit | Attending: Radiology | Admitting: Radiology

## 2014-03-20 DIAGNOSIS — D219 Benign neoplasm of connective and other soft tissue, unspecified: Secondary | ICD-10-CM

## 2014-03-20 NOTE — Progress Notes (Signed)
2 wk follow up [place].  Afebrile.  States that she has been experiencing occasional night sweats.    Appetite:  Fair.    Continues to experience minimal spotting. LMP prior to [place].    Occasional cramping.  Does not require pain meds.  States that she has been feeling fatigued.    Returns to work on 03/21/2014 without restrictions.  Rykin Route Riki Rusk, RN 03/20/2014 12:08 PM

## 2014-03-21 ENCOUNTER — Encounter: Payer: Self-pay | Admitting: Radiology

## 2014-06-07 ENCOUNTER — Telehealth: Payer: Self-pay | Admitting: Radiology

## 2014-06-07 NOTE — Telephone Encounter (Signed)
3 month follow up phone call for post [place] status check  Patient states that she is doing well post [place].  Menstrual flow has decreased, improved bladder control.    Prefers to schedule follow up appointment with Dr Barbie Banner for 6 months post [place].  Deandra Gadson Sheldon, RN 06/07/2014 11:13 AM

## 2014-10-23 ENCOUNTER — Other Ambulatory Visit (HOSPITAL_COMMUNITY): Payer: Self-pay | Admitting: Interventional Radiology

## 2014-10-23 DIAGNOSIS — D259 Leiomyoma of uterus, unspecified: Secondary | ICD-10-CM

## 2014-10-25 ENCOUNTER — Inpatient Hospital Stay: Admission: RE | Admit: 2014-10-25 | Payer: 59 | Source: Ambulatory Visit

## 2015-02-14 ENCOUNTER — Ambulatory Visit (INDEPENDENT_AMBULATORY_CARE_PROVIDER_SITE_OTHER): Payer: 59 | Admitting: Family Medicine

## 2015-02-14 VITALS — BP 112/70 | HR 89 | Temp 98.2°F | Resp 16 | Ht 60.0 in | Wt 174.2 lb

## 2015-02-14 DIAGNOSIS — T783XXA Angioneurotic edema, initial encounter: Secondary | ICD-10-CM

## 2015-02-14 LAB — POCT CBC
Granulocyte percent: 50.2 %G (ref 37–80)
HEMATOCRIT: 37.7 % (ref 37.7–47.9)
Hemoglobin: 11 g/dL — AB (ref 12.2–16.2)
LYMPH, POC: 3.6 — AB (ref 0.6–3.4)
MCH: 25.8 pg — AB (ref 27–31.2)
MCHC: 29.1 g/dL — AB (ref 31.8–35.4)
MCV: 88.7 fL (ref 80–97)
MID (cbc): 0.4 (ref 0–0.9)
MPV: 8.5 fL (ref 0–99.8)
POC Granulocyte: 4.1 (ref 2–6.9)
POC LYMPH PERCENT: 44.8 %L (ref 10–50)
POC MID %: 5 %M (ref 0–12)
Platelet Count, POC: 281 10*3/uL (ref 142–424)
RBC: 4.25 M/uL (ref 4.04–5.48)
RDW, POC: 16.5 %
WBC: 8.1 10*3/uL (ref 4.6–10.2)

## 2015-02-14 MED ORDER — METHYLPREDNISOLONE ACETATE 80 MG/ML IJ SUSP
80.0000 mg | Freq: Once | INTRAMUSCULAR | Status: AC
  Administered 2015-02-14: 80 mg via INTRAMUSCULAR

## 2015-02-14 MED ORDER — EPINEPHRINE HCL 1 MG/ML IJ SOLN: 1.0000 mg | Freq: Once | INTRAMUSCULAR | Status: AC

## 2015-02-14 MED ORDER — PREDNISONE 20 MG PO TABS: ORAL_TABLET | ORAL | Status: AC

## 2015-02-14 MED ORDER — HYDROXYZINE HCL 25 MG PO TABS: 12.5000 mg | ORAL_TABLET | Freq: Three times a day (TID) | ORAL | Status: AC | PRN

## 2015-02-14 MED ORDER — CETIRIZINE HCL 10 MG PO TABS: 10.0000 mg | ORAL_TABLET | Freq: Every day | ORAL | Status: AC

## 2015-02-14 NOTE — Progress Notes (Signed)
02/15/2015 at 10:43 AM  Teresa Preston / DOB: 03/18/1966 / MRN: [id number]  The patient has Leiomyoma of body of uterus; Uterine fibroid; and Fibroids on her problem list.  SUBJECTIVE  Chief complaint: Swollen Lips   History of present illness: Teresa Preston is 49 y.o. well appearing female presenting for lip swelling that started 4 days ago. She endorses lip itching a burning as well. She denies tongue and throat swelling. She She has been trying benadryl   She reports having some Salmon on Saturday and noticed the swelling the next day.  She doubted that the salmon caused the allergy and had it again on Tuesday.  This is the only new food she has tried. She denies new medications. She denies eating eggs and mild.  She has had similar reactions before to high dose ibuprofen, but this case is more severe. She denies a change in beauty products.   She reports a history of night sweats secondary to menopause.  She has a history of fibroids and had a procedure last year which pushed her into menopause.  She reports now having some night sweats which last roughly 20 minutes and she can not sleep through them.  She does not use birth control.   She  has a past medical history of Fibroids; Cystitis, interstitial; Anemia; and Blood transfusion without reported diagnosis.    She has a current medication list which includes the following prescription(s): cetirizine, epinephrine, hydroxyzine, and prednisone.  Teresa Preston is allergic to bactrim. She  reports that she has never smoked. She has never used smokeless tobacco. She reports that she does not drink alcohol or use illicit drugs. She  has no sexual activity history on file. The patient  has past surgical history that includes Myomectomy.  Her family history includes Colon cancer in her father; Diabetes in her brother; Hyperlipidemia in her brother and sister; Uterine cancer in her mother.  Review of Systems  Constitutional: Negative for fever and  chills.  HENT: Negative for congestion and sore throat.   Eyes: Negative.   Respiratory: Negative for cough and shortness of breath.   Cardiovascular: Negative for chest pain and palpitations.  Gastrointestinal: Negative for heartburn, nausea and vomiting.  Genitourinary: Negative.   Skin: Positive for itching.  Neurological: Negative for headaches.    OBJECTIVE  Her  height is 5' (1.524 m) and weight is 174 lb 4 oz (79.039 kg). Her oral temperature is 98.2 F (36.8 C). Her blood pressure is 112/70 and her pulse is 89. Her respiration is 16 and oxygen saturation is 98%.  The patient's body mass index is 34.03 kg/(m^2).  Physical Exam  Constitutional: She is oriented to person, place, and time. She appears well-developed and well-nourished. No distress.  HENT:  Head:    Right Ear: Hearing, tympanic membrane, external ear and ear canal normal.  Left Ear: Hearing, tympanic membrane, external ear and ear canal normal.  Nose: No mucosal edema. Right sinus exhibits no maxillary sinus tenderness and no frontal sinus tenderness. Left sinus exhibits no maxillary sinus tenderness and no frontal sinus tenderness.  Mouth/Throat: Uvula is midline, oropharynx is clear and moist and mucous membranes are normal.    Cardiovascular: Normal rate, regular rhythm and normal heart sounds.   Respiratory: Effort normal and breath sounds normal. She has no wheezes. She has no rales.  Neurological: She is alert and oriented to person, place, and time.  Skin: Skin is warm and dry. She is not diaphoretic.  Psychiatric:  She has a normal mood and affect.    Results for orders placed or performed in visit on 02/14/15 (from the past 24 hour(s))  POCT CBC     Status: Abnormal   Collection Time: 02/14/15  7:02 PM  Result Value Ref Range   WBC 8.1 4.6 - 10.2 K/uL   Lymph, poc 3.6 (A) 0.6 - 3.4   POC LYMPH PERCENT 44.8 10 - 50 %L   MID (cbc) 0.4 0 - 0.9   POC MID % 5.0 0 - 12 %M   POC Granulocyte 4.1 2 - 6.9    Granulocyte percent 50.2 37 - 80 %G   RBC 4.25 4.04 - 5.48 M/uL   Hemoglobin 11.0 (A) 12.2 - 16.2 g/dL   HCT, POC 37.7 37.7 - 47.9 %   MCV 88.7 80 - 97 fL   MCH, POC 25.8 (A) 27 - 31.2 pg   MCHC 29.1 (A) 31.8 - 35.4 g/dL   RDW, POC 16.5 %   Platelet Count, POC 281 142 - 424 K/uL   MPV 8.5 0 - 99.8 fL    ASSESSMENT & PLAN  Teresa Preston was seen today for swollen lips.  Diagnoses and all orders for this visit:  Idiopathic angioedema, initial encounter Orders: -     methylPREDNISolone acetate (DEPO-MEDROL) injection 80 mg; Inject 1 mL (80 mg total) into the muscle once. -     hydrOXYzine (ATARAX/VISTARIL) 25 MG tablet; Take 0.5-1 tablets (12.5-25 mg total) by mouth every 8 (eight) hours as needed for anxiety or itching. -     POCT CBC -     EPINEPHrine (ADRENALIN) 1 MG/ML injection; Inject 1 mL (1 mg total) into the muscle once. If used, go directly to ED. (Patient not taking: Reported on 02/15/2015) -     predniSONE (DELTASONE) 20 MG tablet; Take 3 PO QAM x3days, 2 PO QAM x3days, 1 PO QAM x3days -     cetirizine (ZYRTEC) 10 MG tablet; Take 1 tablet (10 mg total) by mouth daily.    The patient was advised to call or come back to clinic if she does not see an improvement in symptoms, or worsens with the above plan.   Philis Fendt, MHS, [name] [facility] 02/15/2015 10:43 AM

## 2015-02-15 ENCOUNTER — Ambulatory Visit (INDEPENDENT_AMBULATORY_CARE_PROVIDER_SITE_OTHER): Payer: 59 | Admitting: Family Medicine

## 2015-02-15 ENCOUNTER — Telehealth: Payer: Self-pay

## 2015-02-15 VITALS — BP 122/88 | HR 95 | Temp 98.2°F | Resp 17 | Ht 60.0 in

## 2015-02-15 DIAGNOSIS — T783XXA Angioneurotic edema, initial encounter: Secondary | ICD-10-CM

## 2015-02-15 MED ORDER — DIPHENHYDRAMINE HCL 50 MG/ML IJ SOLN
50.0000 mg | Freq: Once | INTRAMUSCULAR | Status: AC
  Administered 2015-02-15: 50 mg via INTRAMUSCULAR

## 2015-02-15 MED ORDER — RANITIDINE HCL 150 MG PO TABS
300.0000 mg | ORAL_TABLET | Freq: Once | ORAL | Status: AC
  Administered 2015-02-15: 300 mg via ORAL

## 2015-02-15 MED ORDER — RANITIDINE HCL 150 MG PO TABS: 150.0000 mg | ORAL_TABLET | Freq: Two times a day (BID) | ORAL | Status: AC

## 2015-02-15 MED ORDER — METHYLPREDNISOLONE SODIUM SUCC 125 MG IJ SOLR
125.0000 mg | Freq: Once | INTRAMUSCULAR | Status: AC
  Administered 2015-02-15: 125 mg via INTRAMUSCULAR

## 2015-02-15 NOTE — Progress Notes (Signed)
   02/15/2015 at 11:25 AM  Teresa Preston / DOB: January 04, 1966 / MRN: [id number]  The patient has Leiomyoma of body of uterus; Uterine fibroid; and Fibroids on her problem list.  SUBJECTIVE  Chief complaint: Edema   History of present illness: Teresa Preston is 49 y.o. well appearing female presenting for f/u of angioedema that has worsened since 02/14/15.  She was seen yesterday and given po prescriptions as well as 80 IM of depomedrol.  She did not take any of the po medications for fear that her swelling would get worse. This morning the swelling is worse.  She denies SOB, throat and tongue swelling, and presyncope.    She  has a past medical history of Fibroids; Cystitis, interstitial; Anemia; and Blood transfusion without reported diagnosis.    Meds reviewed by me and updated where necessary.    Teresa Preston is allergic to bactrim. She  reports that she has never smoked. She has never used smokeless tobacco. She reports that she does not drink alcohol or use illicit drugs. She  has no sexual activity history on file. The patient  has past surgical history that includes Myomectomy.  Her family history includes Colon cancer in her father; Diabetes in her brother; Hyperlipidemia in her brother and sister; Uterine cancer in her mother.  Review of Systems  Constitutional: Negative for fever and chills.  Respiratory: Negative for shortness of breath.   Cardiovascular: Negative for leg swelling.  Gastrointestinal: Negative for nausea.  Genitourinary: Negative.   Musculoskeletal: Negative.   Skin: Positive for itching.  Neurological: Negative for headaches.    OBJECTIVE  Her  height is 5' (1.524 m). Her oral temperature is 98.2 F (36.8 C). Her blood pressure is 122/88 and her pulse is 95. Her respiration is 17 and oxygen saturation is 98%.  The patient's body mass index is unknown because there is no weight on file.  Physical Exam  Constitutional: She is oriented to person, place, and time. She  appears well-developed and well-nourished.  Cardiovascular: Normal rate and regular rhythm.   Respiratory: Effort normal and breath sounds normal.  GI: Soft. Bowel sounds are normal.  Neurological: She is alert and oriented to person, place, and time. No cranial nerve deficit. Coordination normal.  Skin: Skin is warm and dry.  Psychiatric: She has a normal mood and affect.    ASSESSMENT & PLAN  Teresa Preston was seen today for edema.  Diagnoses and all orders for this visit:  Idiopathic angioedema, initial encounter:  Patient advised to continue the course from yesterday's plan.  Advised that she sleep in an inclined position. She she develop tongue or throat swelling advised she use her epipen and go the nearest ED.  Orders: -     ranitidine (ZANTAC) tablet 300 mg; Take 2 tablets (300 mg total) by mouth once. -     diphenhydrAMINE (BENADRYL) injection 50 mg; Inject 1 mL (50 mg total) into the muscle once. -     methylPREDNISolone sodium succinate (SOLU-MEDROL) 125 mg/2 mL injection 125 mg; Inject 2 mLs (125 mg total) into the muscle once.    The patient was advised to call or come back to clinic if she does not see an improvement in symptoms, or worsens with the above plan.   Philis Fendt, MHS, [name] [facility] 02/15/2015 11:25 AM

## 2015-02-16 NOTE — Progress Notes (Signed)
History and physical examinations obtained with PA Clark.  Patient with mild to moderate upper and lower lip swelling; no tongue swelling or OP swelling.  No neck swelling. No respiratory distress.  Agree with assessment and plan. Kristi Elayne Guerin, M.D. [facility] [address] [place], [place]  [address] [phone] phone [phone] fax

## 2015-02-16 NOTE — Progress Notes (Signed)
History and physical examinations reviewed in detail with PA Clark.  Agree with assessment and plan.  Pt administered Depomedrol 80mg  IM, Zyrtec 10mg  po, and Ranitidine 150mg  po in office.  No respiratory distress with symptoms. Chantil Bari Elayne Guerin, M.D. [facility] [address] [place], [place]  [address] [phone] phone [phone] fax

## 2015-04-05 ENCOUNTER — Ambulatory Visit (INDEPENDENT_AMBULATORY_CARE_PROVIDER_SITE_OTHER): Payer: 59 | Admitting: Physician Assistant

## 2015-04-05 VITALS — BP 110/76 | HR 81 | Temp 98.6°F | Resp 14 | Ht 60.0 in | Wt 172.6 lb

## 2015-04-05 DIAGNOSIS — H1013 Acute atopic conjunctivitis, bilateral: Secondary | ICD-10-CM

## 2015-04-05 DIAGNOSIS — J302 Other seasonal allergic rhinitis: Secondary | ICD-10-CM

## 2015-04-05 MED ORDER — FLUTICASONE PROPIONATE 50 MCG/ACT NA SUSP: 2.0000 | Freq: Every day | NASAL | Status: AC

## 2015-04-05 MED ORDER — OLOPATADINE HCL 0.1 % OP SOLN: 1.0000 [drp] | Freq: Two times a day (BID) | OPHTHALMIC | Status: AC

## 2015-04-05 NOTE — Progress Notes (Signed)
   Subjective:    Patient ID: Teresa Preston, female    DOB: Jan 06, 1966, 49 y.o.   MRN: [id number]  HPI Patient presents for conjunctivitis that started this morning when she woke up. Both eyes were red and had a lot of white crust in lashes, but not any mucus on eye or in eyelashes. Eyes itchy, burning, and are a little painful. Has rubbed eyes a lot and has made worse. Endorses sneezing, some cough, sinus pressure, congestion, and rhinorrhea. Denies recent illness, except flare in allergies. Denies vision change, photophobia, fever, HA, ear pressure, SOB, CP, or sore throat. H/o allergies, but not asthma. No known sick contacts. Med allergy to bactrim.    Review of Systems  Constitutional: Negative for fever and chills.  HENT: Positive for congestion, rhinorrhea, sinus pressure and sneezing. Negative for ear pain, postnasal drip and sore throat.   Eyes: Positive for pain, discharge (watery), redness and itching. Negative for photophobia and visual disturbance.  Respiratory: Positive for cough. Negative for shortness of breath and wheezing.   Cardiovascular: Negative for chest pain.  Gastrointestinal: Negative for nausea and vomiting.  Neurological: Negative for dizziness and headaches.       Objective:   Physical Exam  Constitutional: She appears well-developed and well-nourished. No distress.  Blood pressure 110/76, pulse 81, temperature 98.6 F (37 C), temperature source Oral, resp. rate 14, height 5' (1.524 m), weight 172 lb 9.6 oz (78.291 kg), SpO2 96 %.  HENT:  Head: Normocephalic and atraumatic.  Right Ear: Tympanic membrane, external ear and ear canal normal.  Left Ear: Tympanic membrane, external ear and ear canal normal.  Nose: Rhinorrhea present. No mucosal edema. Right sinus exhibits no maxillary sinus tenderness and no frontal sinus tenderness. Left sinus exhibits no maxillary sinus tenderness and no frontal sinus tenderness.  Mouth/Throat: Uvula is midline, oropharynx is  clear and moist and mucous membranes are normal. No oropharyngeal exudate, posterior oropharyngeal edema or posterior oropharyngeal erythema.  Eyes: EOM are normal. Pupils are equal, round, and reactive to light. Right eye exhibits discharge (watery). Right eye exhibits no chemosis. Left eye exhibits discharge (watery). Left eye exhibits no chemosis. Right conjunctiva is injected. Right conjunctiva has no hemorrhage. Left conjunctiva is injected. Left conjunctiva has no hemorrhage.  Neck: Normal range of motion. Neck supple. No thyromegaly present.  Cardiovascular: Normal rate, regular rhythm and normal heart sounds.  Exam reveals no gallop and no friction rub.   No murmur heard. Pulmonary/Chest: Effort normal and breath sounds normal. No respiratory distress. She has no wheezes. She has no rales.  Lymphadenopathy:    She has no cervical adenopathy.  Skin: She is not diaphoretic.      Assessment & Plan:  1. Allergic conjunctivitis, bilateral - olopatadine (PATANOL) 0.1 % ophthalmic solution; Place 1 drop into both eyes 2 (two) times daily.  Dispense: 5 mL; Refill: 1  2. Other seasonal allergic rhinitis Continue cetirizine daily. - fluticasone (FLONASE) 50 MCG/ACT nasal spray; Place 2 sprays into both nostrils daily.  Dispense: 16 g; Refill: [place] [name]  [facility] 04/05/2015 2:40 PM

## 2015-06-05 ENCOUNTER — Ambulatory Visit
Admission: RE | Admit: 2015-06-05 | Discharge: 2015-06-05 | Disposition: A | Discharge status: Home / Self Care | Payer: Self-pay | Source: Ambulatory Visit | Attending: Interventional Radiology | Admitting: Interventional Radiology

## 2015-06-05 ENCOUNTER — Other Ambulatory Visit (HOSPITAL_COMMUNITY): Payer: Self-pay | Admitting: Interventional Radiology

## 2015-06-05 DIAGNOSIS — D259 Leiomyoma of uterus, unspecified: Secondary | ICD-10-CM

## 2015-06-05 NOTE — Progress Notes (Signed)
Patient ID: Teresa Preston, female   DOB: 07/28/1966, 49 y.o.   MRN: [id number]    Chief Complaint: Chief Complaint  Patient presents with  . Follow-up    15 mo follow up [place]       History of Present Illness: Teresa Preston is a 49 y.o. female who underwent uterine artery embolization last year. She did great during the year after her embolization. This year, beginning in March, she began to have increased bleeding and menorrhagia again. The periods are not as heavy as they were, but they are concerning to her. She denies any increased pain but does describe some bloating. Her hemoglobin prior to treatment was 8 and her most recent hemoglobin from March was 11. She did not have a post treatment MRI yet.  Past Medical History  Diagnosis Date  . Fibroids   . Cystitis, interstitial   . Anemia   . Blood transfusion without reported diagnosis   . Allergy     Past Surgical History  Procedure Laterality Date  . Myomectomy      Allergies: Bactrim  Medications: Prior to Admission medications   Medication Sig Start Date End Date Taking? Authorizing Provider  fluticasone (FLONASE) 50 MCG/ACT nasal spray Place 2 sprays into both nostrils daily. 04/05/15  Yes [name] Brewington, [name]  hydrOXYzine (ATARAX/VISTARIL) 25 MG tablet Take 0.5-1 tablets (12.5-25 mg total) by mouth every 8 (eight) hours as needed for anxiety or itching. 02/14/15  Yes [name], [name]  olopatadine (PATANOL) 0.1 % ophthalmic solution Place 1 drop into both eyes 2 (two) times daily. 04/05/15  Yes [name] Brewington, [name]  polyethylene glycol (MIRALAX / GLYCOLAX) packet Take 17 g by mouth daily.   Yes Historical Provider, MD  ranitidine (ZANTAC) 150 MG tablet Take 1 tablet (150 mg total) by mouth 2 (two) times daily. 02/15/15  Yes [name], [name]  cetirizine (ZYRTEC) 10 MG tablet Take 1 tablet (10 mg total) by mouth daily. Patient not taking: Reported on 04/05/2015 02/14/15   [name], [name]  EPINEPHrine  (ADRENALIN) 1 MG/ML injection Inject 1 mL (1 mg total) into the muscle once. If used, go directly to ED. Patient not taking: Reported on 02/15/2015 02/14/15   [name], [name]  oxymetazoline (AFRIN) 0.05 % nasal spray Place 1 spray into both nostrils 2 (two) times daily.    Historical Provider, MD  predniSONE (DELTASONE) 20 MG tablet Take 3 PO QAM x3days, 2 PO QAM x3days, 1 PO QAM x3days Patient not taking: Reported on 04/05/2015 02/14/15   [name], [name]     Family History  Problem Relation Age of Onset  . Uterine cancer Mother   . Colon cancer Father   . Diabetes Brother   . Hyperlipidemia Brother   . Hyperlipidemia Sister     History   Social History  . Marital Status: Married    Spouse Name: N/A  . Number of Children: N/A  . Years of Education: N/A   Social History Main Topics  . Smoking status: Never Smoker   . Smokeless tobacco: Never Used  . Alcohol Use: No  . Drug Use: No  . Sexual Activity: Not on file   Other Topics Concern  . Not on file   Social History Narrative       Review of Systems  Vital Signs: BP 105/74 mmHg  Pulse 80  Temp(Src) 98.3 F (36.8 C) (Oral)  Resp 14  SpO2 96%  LMP 05/21/2015 (Exact  Date)  Physical Exam  Constitutional: She is oriented to person, place, and time. She appears well-developed and well-nourished.  Cardiovascular: Normal rate.   Pulmonary/Chest: Effort normal.  Musculoskeletal: Normal range of motion.  Neurological: She is alert and oriented to person, place, and time.        Imaging: No results found.  Labs:  CBC:  Recent Labs  02/14/15 1902  WBC 8.1  HGB 11.0*  HCT 37.7    COAGS: No results for input(s): INR, APTT in the last 8760 hours.  BMP: No results for input(s): NA, K, CL, CO2, GLUCOSE, BUN, CALCIUM, CREATININE, GFRNONAA, GFRAA in the last 8760 hours.  Invalid input(s): CMP  LIVER FUNCTION TESTS: No results for input(s): BILITOT, AST, ALT, ALKPHOS, PROT, ALBUMIN in the last  8760 hours.  TUMOR MARKERS: No results for input(s): AFPTM, CEA, CA199, CHROMGRNA in the last 8760 hours.  Assessment and Plan:  15 months after uterine artery embolization, menorrhagia has partially recurred. MRI of the pelvis with and without contrast is recommended to evaluate treatment and the status of her fibroids. In addition, the endometrial stripe and junctional zone can be evaluated.  In addition, after organ shifting and uterus shrinking, it is essential to undergo a physical exercise program, in order to strengthen the core and resume more normal bowel and genitourinary activity. I am recommending an exercise program at a local gym for this.    Signed: Nitesh Pitstick, ART A 06/05/2015, 9:13 AM   I spent a total of   15 Minutes in face to face in clinical consultation, greater than 50% of which was counseling/coordinating care for uterine artery embolization.

## 2015-06-25 ENCOUNTER — Ambulatory Visit (HOSPITAL_COMMUNITY)
Admission: RE | Admit: 2015-06-25 | Discharge: 2015-06-25 | Disposition: A | Discharge status: Home / Self Care | Payer: 59 | Source: Ambulatory Visit | Attending: Interventional Radiology | Admitting: Interventional Radiology

## 2015-06-25 DIAGNOSIS — D259 Leiomyoma of uterus, unspecified: Secondary | ICD-10-CM | POA: Diagnosis present

## 2015-06-25 MED ORDER — GADOBENATE DIMEGLUMINE 529 MG/ML IV SOLN
15.0000 mL | Freq: Once | INTRAVENOUS | Status: AC | PRN
  Administered 2015-06-25: 15 mL via INTRAVENOUS

## 2015-07-03 ENCOUNTER — Telehealth: Payer: Self-pay | Admitting: Interventional Radiology

## 2015-07-03 NOTE — Progress Notes (Signed)
Patient ID: Teresa Preston, female   DOB: Jul 20, 1966, 49 y.o.   MRN: [id number] I spoke with Ms. Divelbiss today after reviewing her MRI. To recap, she has had continued spotting and her periods have begun to become heavy again. A six-month post [place] MRI was performed. 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 3 of the central fibroids are sloughing into her endometrial canal. I explained that this is typically self-limited and will cease once the passage of fibroid material resolves. She may benefit from progesterone therapy, and possibly a D&C. I've recommended that she follow-up with her OB/GYN doctor and consider these issues. Ultimately, hysterectomy may be required to relieve her symptoms. Because all of her fibroids are successfully treated and are necrotic, repeat uterine artery artery embolization is not indicated.

## 2015-12-24 ENCOUNTER — Other Ambulatory Visit: Payer: Self-pay

## 2015-12-24 ENCOUNTER — Other Ambulatory Visit: Payer: Self-pay | Admitting: Nurse Practitioner

## 2015-12-24 DIAGNOSIS — N644 Mastodynia: Secondary | ICD-10-CM

## 2015-12-26 ENCOUNTER — Ambulatory Visit
Admission: RE | Admit: 2015-12-26 | Discharge: 2015-12-26 | Disposition: A | Discharge status: Home / Self Care | Payer: 59 | Source: Ambulatory Visit | Attending: Nurse Practitioner | Admitting: Nurse Practitioner

## 2015-12-26 ENCOUNTER — Other Ambulatory Visit: Payer: Self-pay | Admitting: Nurse Practitioner

## 2015-12-26 DIAGNOSIS — N644 Mastodynia: Secondary | ICD-10-CM

## 2016-04-24 NOTE — Telephone Encounter (Signed)
error 

## 2016-04-28 NOTE — Progress Notes (Signed)
dictated

## 2016-08-10 ENCOUNTER — Ambulatory Visit (HOSPITAL_COMMUNITY)
Admission: EM | Admit: 2016-08-10 | Discharge: 2016-08-10 | Disposition: A | Discharge status: Home / Self Care | Payer: 59 | Attending: Emergency Medicine | Admitting: Emergency Medicine

## 2016-08-10 ENCOUNTER — Encounter (HOSPITAL_COMMUNITY): Payer: Self-pay

## 2016-08-10 DIAGNOSIS — J029 Acute pharyngitis, unspecified: Secondary | ICD-10-CM | POA: Insufficient documentation

## 2016-08-10 DIAGNOSIS — J309 Allergic rhinitis, unspecified: Secondary | ICD-10-CM | POA: Insufficient documentation

## 2016-08-10 DIAGNOSIS — J3089 Other allergic rhinitis: Secondary | ICD-10-CM | POA: Diagnosis not present

## 2016-08-10 DIAGNOSIS — R0982 Postnasal drip: Secondary | ICD-10-CM | POA: Insufficient documentation

## 2016-08-10 LAB — POCT RAPID STREP A: STREPTOCOCCUS, GROUP A SCREEN (DIRECT): NEGATIVE

## 2016-08-10 MED ORDER — PHENYLEPHRINE-CHLORPHEN-DM 10-4-12.5 MG/5ML PO LIQD: 5.0000 mL | ORAL | 0 refills | Status: AC | PRN

## 2016-08-10 NOTE — Discharge Instructions (Signed)
Take the prescription medication as directed. This may cause drowsiness. Do not take additional cold medicines with it. He may use saline nasal spray frequently. Cepacol lozenges for sore throat pain, ibuprofen 6 mg every 6 hours as needed. Drink plenty fluids and stay well-hydrated

## 2016-08-10 NOTE — ED Provider Notes (Signed)
CSN: [id number]     Arrival date & time 08/10/16  1821 History   First MD Initiated Contact with Patient 08/10/16 2047     Chief Complaint  Patient presents with  . Sore Throat   (Consider location/radiation/quality/duration/timing/severity/associated sxs/prior Treatment) 50 year old female complaining of a sore throat over the past 2-3 days. Also complaining of posterior pharyngeal itching sensation of swelling. Denies earache or fever. Taking no meds. Patient states she is able to swallow food and drink.      Past Medical History:  Diagnosis Date  . Allergy   . Anemia   . Blood transfusion without reported diagnosis   . Cystitis, interstitial   . Fibroids    Past Surgical History:  Procedure Laterality Date  . MYOMECTOMY     Family History  Problem Relation Age of Onset  . Uterine cancer Mother   . Colon cancer Father   . Diabetes Brother   . Hyperlipidemia Sister   . Hyperlipidemia Brother    Social History  Substance Use Topics  . Smoking status: Never Smoker  . Smokeless tobacco: Never Used  . Alcohol use No   OB History    No data available     Review of Systems  Constitutional: Negative for activity change, fatigue and fever.  HENT: Positive for postnasal drip and sore throat. Negative for congestion, ear pain and trouble swallowing.   Eyes: Negative.   Respiratory: Negative.   Gastrointestinal: Negative.   Skin: Negative.   Neurological: Negative.   All other systems reviewed and are negative.   Allergies  Multihance [gadobenate] and Bactrim [sulfamethoxazole-trimethoprim]  Home Medications   Prior to Admission medications   Medication Sig Start Date End Date Taking? Authorizing Provider  cetirizine (ZYRTEC) 10 MG tablet Take 1 tablet (10 mg total) by mouth daily. Patient not taking: Reported on 08/10/2016 02/14/15   [name], [name]  EPINEPHrine (ADRENALIN) 1 MG/ML injection Inject 1 mL (1 mg total) into the muscle once. If used, go directly  to ED. Patient not taking: Reported on 08/10/2016 02/14/15   [name], [name]  fluticasone [facility]) 50 MCG/ACT nasal spray Place 2 sprays into both nostrils daily. 04/05/15   [name] Brewington, [name]  hydrOXYzine (ATARAX/VISTARIL) 25 MG tablet Take 0.5-1 tablets (12.5-25 mg total) by mouth every 8 (eight) hours as needed for anxiety or itching. 02/14/15   [name], [name]  olopatadine (PATANOL) 0.1 % ophthalmic solution Place 1 drop into both eyes 2 (two) times daily. 04/05/15   [name] Brewington, [name]  oxymetazoline (AFRIN) 0.05 % nasal spray Place 1 spray into both nostrils 2 (two) times daily.    Historical Provider, MD  Phenylephrine-Chlorphen-DM 08-27-11.5 MG/5ML LIQD Take 5 mLs by mouth every 4 (four) hours as needed. 08/10/16   [name], NP  polyethylene glycol (MIRALAX / GLYCOLAX) packet Take 17 g by mouth daily.    Historical Provider, MD  predniSONE (DELTASONE) 20 MG tablet Take 3 PO QAM x3days, 2 PO QAM x3days, 1 PO QAM x3days Patient not taking: Reported on 08/10/2016 02/14/15   [name], [name]  ranitidine (ZANTAC) 150 MG tablet Take 1 tablet (150 mg total) by mouth 2 (two) times daily. 02/15/15   [name], [name]   Meds Ordered and Administered this Visit  Medications - No data to display  BP 120/74 (BP Location: Left Arm)   Pulse 91   Temp 97.8 F (36.6 C) (Oral)   Resp 12   SpO2 100%  No  data found.   Physical Exam  Constitutional: She is oriented to person, place, and time. She appears well-developed and well-nourished. No distress.  HENT:  Head: Normocephalic and atraumatic.  Mouth/Throat: No oropharyngeal exudate.  Bilateral TMs appear normal. Oropharynx with minor erythema and cobblestoning. Scant clear PND. No exudates or other abnormal-appearing lesions. Airway is widely patent. No stridor.  Eyes: EOM are normal.  Neck: Normal range of motion. Neck supple.  Cardiovascular: Normal rate, regular rhythm, normal heart sounds and intact distal  pulses.   Pulmonary/Chest: Effort normal and breath sounds normal. No respiratory distress. She has no wheezes.  Lymphadenopathy:    She has no cervical adenopathy.  Neurological: She is alert and oriented to person, place, and time.  Psychiatric: She has a normal mood and affect.  Nursing note and vitals reviewed.   [facility] Course   Clinical Course    Procedures (including critical care time)  Labs Review Labs Reviewed  POCT RAPID STREP A    Imaging Review No results found.   Visual Acuity Review  Right Eye Distance:   Left Eye Distance:   Bilateral Distance:    Right Eye Near:   Left Eye Near:    Bilateral Near:         MDM   1. Pharyngitis   2. Sore throat   3. PND (post-nasal drip)   4. Other allergic rhinitis    Take the prescription medication as directed. This may cause drowsiness. Do not take additional cold medicines with it. He may use saline nasal spray frequently. Cepacol lozenges for sore throat pain, ibuprofen 6 mg every 6 hours as needed. Drink plenty fluids and stay well-hydrated Meds ordered this encounter  Medications  . Phenylephrine-Chlorphen-DM 08-27-11.5 MG/5ML LIQD    Sig: Take 5 mLs by mouth every 4 (four) hours as needed.    Dispense:  120 mL    Refill:  0    Order Specific Question:   Supervising Provider    Answer:   Melynda Ripple [[id number]]       [name], NP 08/10/16 2142

## 2016-08-10 NOTE — ED Triage Notes (Signed)
Patent presents with sore throat x3 days states it feels as though there is a bump on back of throat unable to swallow food and drink. No acute distress

## 2016-08-13 LAB — CULTURE, GROUP A STREP (THRC)

## 2016-08-21 ENCOUNTER — Ambulatory Visit (HOSPITAL_COMMUNITY)
Admission: EM | Admit: 2016-08-21 | Discharge: 2016-08-21 | Disposition: A | Discharge status: Home / Self Care | Payer: 59 | Attending: Family Medicine | Admitting: Family Medicine

## 2016-08-21 ENCOUNTER — Encounter (HOSPITAL_COMMUNITY): Payer: Self-pay | Admitting: Emergency Medicine

## 2016-08-21 DIAGNOSIS — F458 Other somatoform disorders: Secondary | ICD-10-CM

## 2016-08-21 DIAGNOSIS — R198 Other specified symptoms and signs involving the digestive system and abdomen: Secondary | ICD-10-CM

## 2016-08-21 DIAGNOSIS — R0989 Other specified symptoms and signs involving the circulatory and respiratory systems: Secondary | ICD-10-CM

## 2016-08-21 MED ORDER — GI COCKTAIL ~~LOC~~
30.0000 mL | Freq: Once | ORAL | Status: AC
  Administered 2016-08-21: 30 mL via ORAL

## 2016-08-21 MED ORDER — OMEPRAZOLE 20 MG PO CPDR: 40.0000 mg | DELAYED_RELEASE_CAPSULE | Freq: Two times a day (BID) | ORAL | 1 refills | Status: DC

## 2016-08-21 MED ORDER — OMEPRAZOLE 20 MG PO CPDR: 40.0000 mg | DELAYED_RELEASE_CAPSULE | Freq: Two times a day (BID) | ORAL | 1 refills | Status: AC

## 2016-08-21 MED ORDER — GI COCKTAIL ~~LOC~~
ORAL | Status: AC
  Filled 2016-08-21: qty 30

## 2016-08-21 NOTE — ED Triage Notes (Signed)
Pt states "she feels like something is lodged in back of throat" onset 2 weeks  Seen here on 9/18 for similar sx and is not getting any better  Sx today include: dysphagia and dyspnea  A&O x4... NAD... Talking in complete sentences.

## 2016-08-21 NOTE — Discharge Instructions (Signed)
The symptoms you are feeling is quite common. There are multiple potential causes but can be related to stress and or reflux. Please take the medication as directed as it may help relieve some of your symptoms. We have provided a referral to an ENT if you decide to arrange follow up.

## 2016-08-24 NOTE — ED Provider Notes (Signed)
CSN: [id number]     Arrival date & time 08/21/16  1228 History   None    Chief Complaint  Patient presents with  . Sore Throat   (Consider location/radiation/quality/duration/timing/severity/associated sxs/prior Treatment) Pt reports persistent feeling of lump in her throat. Sensation worse w/ swallowing. She is eating and drinking normally and able to manage secretions.    The history is provided by the patient.  Sore Throat  This is a recurrent problem. The current episode started more than 1 week ago. The problem occurs constantly. The problem has not changed since onset.Pertinent negatives include no chest pain, no abdominal pain, no headaches and no shortness of breath. She has tried nothing for the symptoms.    Past Medical History:  Diagnosis Date  . Allergy   . Anemia   . Blood transfusion without reported diagnosis   . Cystitis, interstitial   . Fibroids    Past Surgical History:  Procedure Laterality Date  . MYOMECTOMY     Family History  Problem Relation Age of Onset  . Uterine cancer Mother   . Colon cancer Father   . Diabetes Brother   . Hyperlipidemia Sister   . Hyperlipidemia Brother    Social History  Substance Use Topics  . Smoking status: Never Smoker  . Smokeless tobacco: Never Used  . Alcohol use No   OB History    No data available     Review of Systems  Respiratory: Negative for shortness of breath.   Cardiovascular: Negative for chest pain.  Gastrointestinal: Negative for abdominal pain.  Neurological: Negative for headaches.  All other systems reviewed and are negative.   Allergies  Multihance [gadobenate] and Bactrim [sulfamethoxazole-trimethoprim]  Home Medications   Prior to Admission medications   Medication Sig Start Date End Date Taking? Authorizing Provider  cetirizine (ZYRTEC) 10 MG tablet Take 1 tablet (10 mg total) by mouth daily. Patient not taking: Reported on 08/21/2016 02/14/15   [name], [name]  EPINEPHrine  (ADRENALIN) 1 MG/ML injection Inject 1 mL (1 mg total) into the muscle once. If used, go directly to ED. Patient not taking: Reported on 08/21/2016 02/14/15   [name], [name]  fluticasone [facility]) 50 MCG/ACT nasal spray Place 2 sprays into both nostrils daily. 04/05/15   [name] Brewington, [name]  hydrOXYzine (ATARAX/VISTARIL) 25 MG tablet Take 0.5-1 tablets (12.5-25 mg total) by mouth every 8 (eight) hours as needed for anxiety or itching. 02/14/15   [name], [name]  olopatadine (PATANOL) 0.1 % ophthalmic solution Place 1 drop into both eyes 2 (two) times daily. 04/05/15   [name] Brewington, [name]  omeprazole (PRILOSEC) 20 MG capsule Take 2 capsules (40 mg total) by mouth 2 (two) times daily before a meal. 08/21/16   [name] Jumar Greenstreet, NP  oxymetazoline (AFRIN) 0.05 % nasal spray Place 1 spray into both nostrils 2 (two) times daily.    Historical Provider, MD  Phenylephrine-Chlorphen-DM 08-27-11.5 MG/5ML LIQD Take 5 mLs by mouth every 4 (four) hours as needed. 08/10/16   [name], NP  polyethylene glycol (MIRALAX / GLYCOLAX) packet Take 17 g by mouth daily.    Historical Provider, MD  predniSONE (DELTASONE) 20 MG tablet Take 3 PO QAM x3days, 2 PO QAM x3days, 1 PO QAM x3days Patient not taking: Reported on 08/21/2016 02/14/15   [name], [name]  ranitidine (ZANTAC) 150 MG tablet Take 1 tablet (150 mg total) by mouth 2 (two) times daily. 02/15/15   [name], [name]  Meds Ordered and Administered this Visit   Medications  gi cocktail (Maalox,Lidocaine,Donnatal) (30 mLs Oral Given 08/21/16 1356)    BP 131/79 (BP Location: Left Arm)   Pulse 76   Temp 98.2 F (36.8 C) (Oral)   Resp 12   SpO2 99%  No data found.   Physical Exam  Constitutional: She is oriented to person, place, and time. She appears well-developed and well-nourished.  HENT:  Head: Normocephalic and atraumatic.  Eyes: Conjunctivae are normal.  Neck: Neck supple.  No palpable masses to bil neck.   Cardiovascular: Normal rate.   Pulmonary/Chest: Effort normal.  Neurological: She is alert and oriented to person, place, and time.  Skin: Skin is warm and dry.  Psychiatric: She has a normal mood and affect.    Urgent Care Course   Clinical Course    Procedures (including critical care time)  Labs Review Labs Reviewed - No data to display  Imaging Review No results found.   Visual Acuity Review  Right Eye Distance:   Left Eye Distance:   Bilateral Distance:    Right Eye Near:   Left Eye Near:    Bilateral Near:         MDM   1. Globus pharyngeus   No apparent relief w/ GI cocktail. Much discussion regarding dx. Pt reassured. ENT referral provided. Pt agreeable.    Jeryl Columbia, NP 08/24/16 [phone]

## 2016-12-30 DIAGNOSIS — H40023 Open angle with borderline findings, high risk, bilateral: Secondary | ICD-10-CM | POA: Diagnosis not present

## 2017-02-26 DIAGNOSIS — H10011 Acute follicular conjunctivitis, right eye: Secondary | ICD-10-CM | POA: Diagnosis not present

## 2017-02-26 DIAGNOSIS — H02841 Edema of right upper eyelid: Secondary | ICD-10-CM | POA: Diagnosis not present

## 2017-05-18 DIAGNOSIS — J0100 Acute maxillary sinusitis, unspecified: Secondary | ICD-10-CM | POA: Diagnosis not present

## 2017-05-18 DIAGNOSIS — R05 Cough: Secondary | ICD-10-CM | POA: Diagnosis not present

## 2017-09-08 DIAGNOSIS — K644 Residual hemorrhoidal skin tags: Secondary | ICD-10-CM | POA: Diagnosis not present

## 2017-09-08 DIAGNOSIS — N644 Mastodynia: Secondary | ICD-10-CM | POA: Diagnosis not present

## 2017-09-08 DIAGNOSIS — K5901 Slow transit constipation: Secondary | ICD-10-CM | POA: Diagnosis not present

## 2017-10-18 ENCOUNTER — Other Ambulatory Visit (HOSPITAL_COMMUNITY)
Admission: RE | Admit: 2017-10-18 | Discharge: 2017-10-18 | Disposition: A | Discharge status: Home / Self Care | Payer: 59 | Source: Ambulatory Visit | Attending: Family Medicine | Admitting: Family Medicine

## 2017-10-18 ENCOUNTER — Other Ambulatory Visit: Payer: Self-pay | Admitting: Family Medicine

## 2017-10-18 DIAGNOSIS — Z124 Encounter for screening for malignant neoplasm of cervix: Secondary | ICD-10-CM | POA: Insufficient documentation

## 2017-10-18 DIAGNOSIS — Z1231 Encounter for screening mammogram for malignant neoplasm of breast: Secondary | ICD-10-CM

## 2017-10-18 DIAGNOSIS — Z01419 Encounter for gynecological examination (general) (routine) without abnormal findings: Secondary | ICD-10-CM | POA: Diagnosis not present

## 2017-10-18 DIAGNOSIS — Z13220 Encounter for screening for lipoid disorders: Secondary | ICD-10-CM | POA: Diagnosis not present

## 2017-10-20 LAB — CYTOLOGY - PAP
Diagnosis: NEGATIVE
HPV (WINDOPATH): NOT DETECTED

## 2017-10-25 ENCOUNTER — Ambulatory Visit
Admission: RE | Admit: 2017-10-25 | Discharge: 2017-10-25 | Disposition: A | Discharge status: Home / Self Care | Payer: 59 | Source: Ambulatory Visit | Attending: Family Medicine | Admitting: Family Medicine

## 2017-10-25 DIAGNOSIS — Z1231 Encounter for screening mammogram for malignant neoplasm of breast: Secondary | ICD-10-CM

## 2017-11-22 DIAGNOSIS — L659 Nonscarring hair loss, unspecified: Secondary | ICD-10-CM | POA: Diagnosis not present

## 2017-11-22 DIAGNOSIS — R232 Flushing: Secondary | ICD-10-CM | POA: Diagnosis not present

## 2018-05-17 DIAGNOSIS — J069 Acute upper respiratory infection, unspecified: Secondary | ICD-10-CM | POA: Diagnosis not present

## 2018-06-28 DIAGNOSIS — N644 Mastodynia: Secondary | ICD-10-CM | POA: Diagnosis not present

## 2018-06-28 DIAGNOSIS — R05 Cough: Secondary | ICD-10-CM | POA: Diagnosis not present

## 2018-06-29 ENCOUNTER — Other Ambulatory Visit: Payer: Self-pay | Admitting: Family Medicine

## 2018-06-29 DIAGNOSIS — N644 Mastodynia: Secondary | ICD-10-CM

## 2018-07-01 ENCOUNTER — Ambulatory Visit: Admission: RE | Admit: 2018-07-01 | Payer: 59 | Source: Ambulatory Visit

## 2018-07-01 ENCOUNTER — Ambulatory Visit
Admission: RE | Admit: 2018-07-01 | Discharge: 2018-07-01 | Disposition: A | Discharge status: Home / Self Care | Payer: 59 | Source: Ambulatory Visit | Attending: Family Medicine | Admitting: Family Medicine

## 2018-07-01 DIAGNOSIS — R928 Other abnormal and inconclusive findings on diagnostic imaging of breast: Secondary | ICD-10-CM | POA: Diagnosis not present

## 2018-07-01 DIAGNOSIS — N644 Mastodynia: Secondary | ICD-10-CM

## 2018-08-01 DIAGNOSIS — H00025 Hordeolum internum left lower eyelid: Secondary | ICD-10-CM | POA: Diagnosis not present

## 2018-08-08 DIAGNOSIS — R002 Palpitations: Secondary | ICD-10-CM | POA: Diagnosis not present

## 2018-08-08 DIAGNOSIS — R0789 Other chest pain: Secondary | ICD-10-CM | POA: Diagnosis not present

## 2018-09-29 ENCOUNTER — Other Ambulatory Visit: Payer: Self-pay | Admitting: Family Medicine

## 2018-09-29 DIAGNOSIS — R2231 Localized swelling, mass and lump, right upper limb: Secondary | ICD-10-CM | POA: Diagnosis not present

## 2018-09-29 DIAGNOSIS — R5381 Other malaise: Secondary | ICD-10-CM

## 2018-09-30 ENCOUNTER — Other Ambulatory Visit: Payer: Self-pay | Admitting: Family Medicine

## 2018-09-30 DIAGNOSIS — R2231 Localized swelling, mass and lump, right upper limb: Secondary | ICD-10-CM

## 2018-10-26 ENCOUNTER — Other Ambulatory Visit: Payer: 59

## 2018-10-26 ENCOUNTER — Ambulatory Visit: Payer: 59

## 2018-10-26 ENCOUNTER — Ambulatory Visit
Admission: RE | Admit: 2018-10-26 | Discharge: 2018-10-26 | Disposition: A | Discharge status: Home / Self Care | Payer: 59 | Source: Ambulatory Visit | Attending: Family Medicine | Admitting: Family Medicine

## 2018-10-26 DIAGNOSIS — Z0189 Encounter for other specified special examinations: Secondary | ICD-10-CM | POA: Diagnosis not present

## 2018-10-26 DIAGNOSIS — R0789 Other chest pain: Secondary | ICD-10-CM | POA: Diagnosis not present

## 2018-10-26 DIAGNOSIS — R002 Palpitations: Secondary | ICD-10-CM | POA: Diagnosis not present

## 2018-10-26 DIAGNOSIS — R2231 Localized swelling, mass and lump, right upper limb: Secondary | ICD-10-CM

## 2018-11-07 ENCOUNTER — Ambulatory Visit: Payer: 59

## 2018-11-07 ENCOUNTER — Ambulatory Visit: Admission: RE | Admit: 2018-11-07 | Payer: 59 | Source: Ambulatory Visit

## 2018-11-14 ENCOUNTER — Ambulatory Visit
Admission: RE | Admit: 2018-11-14 | Discharge: 2018-11-14 | Disposition: A | Discharge status: Home / Self Care | Payer: 59 | Source: Ambulatory Visit | Attending: Family Medicine | Admitting: Family Medicine

## 2018-11-14 DIAGNOSIS — N6489 Other specified disorders of breast: Secondary | ICD-10-CM | POA: Diagnosis not present

## 2018-11-14 DIAGNOSIS — R928 Other abnormal and inconclusive findings on diagnostic imaging of breast: Secondary | ICD-10-CM | POA: Diagnosis not present

## 2018-11-14 DIAGNOSIS — R2231 Localized swelling, mass and lump, right upper limb: Secondary | ICD-10-CM

## 2018-11-19 DIAGNOSIS — H5213 Myopia, bilateral: Secondary | ICD-10-CM | POA: Diagnosis not present

## 2018-11-19 DIAGNOSIS — H524 Presbyopia: Secondary | ICD-10-CM | POA: Diagnosis not present

## 2018-11-19 DIAGNOSIS — H52223 Regular astigmatism, bilateral: Secondary | ICD-10-CM | POA: Diagnosis not present

## 2019-01-04 DIAGNOSIS — Z1211 Encounter for screening for malignant neoplasm of colon: Secondary | ICD-10-CM | POA: Diagnosis not present

## 2019-01-04 DIAGNOSIS — R002 Palpitations: Secondary | ICD-10-CM | POA: Diagnosis not present

## 2019-01-04 DIAGNOSIS — R300 Dysuria: Secondary | ICD-10-CM | POA: Diagnosis not present

## 2019-01-09 ENCOUNTER — Ambulatory Visit (HOSPITAL_COMMUNITY)
Admission: EM | Admit: 2019-01-09 | Discharge: 2019-01-09 | Disposition: A | Discharge status: Home / Self Care | Payer: 59 | Attending: Family Medicine | Admitting: Family Medicine

## 2019-01-09 ENCOUNTER — Encounter (HOSPITAL_COMMUNITY): Payer: Self-pay | Admitting: Emergency Medicine

## 2019-01-09 DIAGNOSIS — N3001 Acute cystitis with hematuria: Secondary | ICD-10-CM | POA: Diagnosis not present

## 2019-01-09 LAB — POCT URINALYSIS DIP (DEVICE)
Bilirubin Urine: NEGATIVE
GLUCOSE, UA: NEGATIVE mg/dL
KETONES UR: NEGATIVE mg/dL
Nitrite: NEGATIVE
Protein, ur: NEGATIVE mg/dL
Specific Gravity, Urine: 1.025 (ref 1.005–1.030)
Urobilinogen, UA: 0.2 mg/dL (ref 0.0–1.0)
pH: 5 (ref 5.0–8.0)

## 2019-01-09 MED ORDER — NITROFURANTOIN MONOHYD MACRO 100 MG PO CAPS: 100.0000 mg | ORAL_CAPSULE | Freq: Two times a day (BID) | ORAL | 0 refills | Status: AC

## 2019-01-09 MED ORDER — PHENAZOPYRIDINE HCL 200 MG PO TABS: 200.0000 mg | ORAL_TABLET | Freq: Three times a day (TID) | ORAL | 0 refills | Status: AC

## 2019-01-09 NOTE — ED Triage Notes (Signed)
Pt here for bladder pain and dysuria; pt sts some left flank pain today

## 2019-01-09 NOTE — ED Provider Notes (Signed)
[place]    CSN: [id number] Arrival date & time: 01/09/19  1206     History   Chief Complaint Chief Complaint  Patient presents with  . Flank Pain  . Dysuria    HPI Teresa Preston is a 53 y.o. female.   Patient is a 53 year old female that presents with suprapubic pressure, left flank pain.  Her symptoms have been constant and worsening over the past few days.  She is also having some associated urinary frequency, dysuria.  Patient does have a history of interstitial cystitis but reports has not had any issues with this in many years.  She was going to go see a her urologist but feels that she could not wait.  Denies any history of kidney stones or hematuria.  No fevers, chills, nausea, vomiting.  No vaginal discharge or bleeding.  ROS per HPI    Flank Pain   Dysuria  Associated symptoms: flank pain     Past Medical History:  Diagnosis Date  . Allergy   . Anemia   . Blood transfusion without reported diagnosis   . Cystitis, interstitial   . Fibroids     Patient Active Problem List   Diagnosis Date Noted  . Fibroid, uterine   . Uterine fibroid 03/07/2014  . Fibroids 03/07/2014  . Leiomyoma of body of uterus 02/20/2014    Past Surgical History:  Procedure Laterality Date  . MYOMECTOMY      OB History   No obstetric history on file.      Home Medications    Prior to Admission medications   Medication Sig Start Date End Date Taking? Authorizing Provider  cetirizine (ZYRTEC) 10 MG tablet Take 1 tablet (10 mg total) by mouth daily. Patient not taking: Reported on 08/21/2016 02/14/15   [name], [name]  EPINEPHrine (ADRENALIN) 1 MG/ML injection Inject 1 mL (1 mg total) into the muscle once. If used, go directly to ED. Patient not taking: Reported on 08/21/2016 02/14/15   [name], [name]  fluticasone [facility]) 50 MCG/ACT nasal spray Place 2 sprays into both nostrils daily. 04/05/15   Brewington, [name], [name]  hydrOXYzine  (ATARAX/VISTARIL) 25 MG tablet Take 0.5-1 tablets (12.5-25 mg total) by mouth every 8 (eight) hours as needed for anxiety or itching. 02/14/15   [name], [name]  nitrofurantoin, macrocrystal-monohydrate, (MACROBID) 100 MG capsule Take 1 capsule (100 mg total) by mouth 2 (two) times daily. 01/09/19   Roxie Kreeger, Tressia Miners A, NP  olopatadine (PATANOL) 0.1 % ophthalmic solution Place 1 drop into both eyes 2 (two) times daily. 04/05/15   Brewington, [name], [name]  omeprazole (PRILOSEC) 20 MG capsule Take 2 capsules (40 mg total) by mouth 2 (two) times daily before a meal. 08/21/16   Schorr, [name], NP  oxymetazoline (AFRIN) 0.05 % nasal spray Place 1 spray into both nostrils 2 (two) times daily.    [provider]  phenazopyridine (PYRIDIUM) 200 MG tablet Take 1 tablet (200 mg total) by mouth 3 (three) times daily. 01/09/19   [name], NP  Phenylephrine-Chlorphen-DM 08-27-11.5 MG/5ML LIQD Take 5 mLs by mouth every 4 (four) hours as needed. 08/10/16   [name], NP  polyethylene glycol (MIRALAX / GLYCOLAX) packet Take 17 g by mouth daily.    [provider]  predniSONE (DELTASONE) 20 MG tablet Take 3 PO QAM x3days, 2 PO QAM x3days, 1 PO QAM x3days Patient not taking: Reported on 08/21/2016 02/14/15   [name], [name]  ranitidine (ZANTAC) 150 MG tablet Take 1 tablet (150 mg total) by mouth 2 (two) times daily. 02/15/15   [name], [name]    Family History Family History  Problem Relation Age of Onset  . Uterine cancer Mother   . Colon cancer Father   . Diabetes Brother   . Hyperlipidemia Sister   . Hyperlipidemia Brother   . Breast cancer Cousin        x2, both in their 31's    Social History Social History   Tobacco Use  . Smoking status: Never Smoker  . Smokeless tobacco: Never Used  Substance Use Topics  . Alcohol use: No    Alcohol/week: 0.0 standard drinks  . Drug use: No     Allergies   Multihance [gadobenate] and Bactrim  [sulfamethoxazole-trimethoprim]   Review of Systems Review of Systems  Genitourinary: Positive for dysuria and flank pain.     Physical Exam Triage Vital Signs ED Triage Vitals [01/09/19 1251]  Enc Vitals Group     BP 113/77     Pulse Rate 81     Resp 18     Temp 97.8 F (36.6 C)     Temp Source Temporal     SpO2 98 %     Weight      Height      Head Circumference      Peak Flow      Pain Score 6     Pain Loc      Pain Edu?      Excl. in [place]?    No data found.  Updated Vital Signs BP 113/77 (BP Location: Right Arm)   Pulse 81   Temp 97.8 F (36.6 C) (Temporal)   Resp 18   SpO2 98%   Visual Acuity Right Eye Distance:   Left Eye Distance:   Bilateral Distance:    Right Eye Near:   Left Eye Near:    Bilateral Near:     Physical Exam Constitutional:      Appearance: Normal appearance.     Comments: Appears in pain.   HENT:     Head: Normocephalic and atraumatic.  Eyes:     Conjunctiva/sclera: Conjunctivae normal.  Neck:     Musculoskeletal: Normal range of motion.  Pulmonary:     Effort: Pulmonary effort is normal.  Abdominal:     Palpations: Abdomen is soft.     Tenderness: There is left CVA tenderness.  Musculoskeletal: Normal range of motion.  Skin:    General: Skin is warm and dry.  Neurological:     Mental Status: She is alert.  Psychiatric:        Mood and Affect: Mood normal.      UC Treatments / Results  Labs (all labs ordered are listed, but only abnormal results are displayed) Labs Reviewed  POCT URINALYSIS DIP (DEVICE) - Abnormal; Notable for the following components:      Result Value   Hgb urine dipstick LARGE (*)    Leukocytes,Ua TRACE (*)    All other components within normal limits  URINE CULTURE    EKG None  Radiology No results found.  Procedures Procedures (including critical care time)  Medications Ordered in UC Medications - No data to display  Initial Impression / Assessment and Plan / UC Course  I  have reviewed the triage vital signs and the nursing notes.  Pertinent labs & imaging results that were available during my care of the patient were reviewed by  me and considered in my medical decision making (see chart for details).     Trace leuks with large blood in urine We will go ahead and treat for urinary tract infection based on symptoms and urine results. Macrobid twice a day for next 5 days along with Pyridium for symptoms Instructed to stay hydrated and drink plenty of water Sending urine for culture Strep precautions that if her symptoms worsen despite treatment she will need to go to the hospital Final Clinical Impressions(s) / UC Diagnoses   Final diagnoses:  Acute cystitis with hematuria     Discharge Instructions     We will go ahead and treat you for an urinary infection Macrobid 2 x day for 5 days.  Sending the urine for culture.  Pyridium 3 x day to help with discomfort.  Follow up as needed for continued or worsening symptoms     ED Prescriptions    Medication Sig Dispense Auth. Provider   nitrofurantoin, macrocrystal-monohydrate, (MACROBID) 100 MG capsule Take 1 capsule (100 mg total) by mouth 2 (two) times daily. 10 capsule Charlyne Robertshaw A, NP   phenazopyridine (PYRIDIUM) 200 MG tablet Take 1 tablet (200 mg total) by mouth 3 (three) times daily. 6 tablet [name], NP     Controlled Substance Prescriptions [place] Controlled Substance Registry consulted? Not Applicable   Orvan July, NP 01/09/19 1501

## 2019-01-09 NOTE — Discharge Instructions (Addendum)
We will go ahead and treat you for an urinary infection Macrobid 2 x day for 5 days.  Sending the urine for culture.  Pyridium 3 x day to help with discomfort.  Follow up as needed for continued or worsening symptoms

## 2019-01-10 LAB — URINE CULTURE

## 2019-01-30 ENCOUNTER — Ambulatory Visit
Admission: RE | Admit: 2019-01-30 | Discharge: 2019-01-30 | Disposition: A | Discharge status: Home / Self Care | Payer: 59 | Source: Ambulatory Visit | Attending: Family Medicine | Admitting: Family Medicine

## 2019-01-30 ENCOUNTER — Other Ambulatory Visit: Payer: Self-pay | Admitting: Family Medicine

## 2019-01-30 DIAGNOSIS — R109 Unspecified abdominal pain: Secondary | ICD-10-CM

## 2019-01-30 DIAGNOSIS — R319 Hematuria, unspecified: Secondary | ICD-10-CM | POA: Diagnosis not present

## 2019-02-01 DIAGNOSIS — R102 Pelvic and perineal pain: Secondary | ICD-10-CM | POA: Diagnosis not present

## 2019-02-01 DIAGNOSIS — R350 Frequency of micturition: Secondary | ICD-10-CM | POA: Diagnosis not present

## 2019-02-01 DIAGNOSIS — N3011 Interstitial cystitis (chronic) with hematuria: Secondary | ICD-10-CM | POA: Diagnosis not present

## 2019-02-01 DIAGNOSIS — R310 Gross hematuria: Secondary | ICD-10-CM | POA: Diagnosis not present

## 2019-02-02 DIAGNOSIS — N132 Hydronephrosis with renal and ureteral calculous obstruction: Secondary | ICD-10-CM | POA: Diagnosis not present

## 2019-02-02 DIAGNOSIS — R310 Gross hematuria: Secondary | ICD-10-CM | POA: Diagnosis not present

## 2019-02-06 DIAGNOSIS — N201 Calculus of ureter: Secondary | ICD-10-CM | POA: Diagnosis not present

## 2019-02-07 DIAGNOSIS — M791 Myalgia, unspecified site: Secondary | ICD-10-CM | POA: Diagnosis not present

## 2019-02-07 DIAGNOSIS — R5383 Other fatigue: Secondary | ICD-10-CM | POA: Diagnosis not present

## 2019-02-07 DIAGNOSIS — E559 Vitamin D deficiency, unspecified: Secondary | ICD-10-CM | POA: Diagnosis not present

## 2019-02-09 DIAGNOSIS — M6282 Rhabdomyolysis: Secondary | ICD-10-CM | POA: Diagnosis not present

## 2019-02-15 ENCOUNTER — Other Ambulatory Visit: Payer: Self-pay

## 2019-02-15 ENCOUNTER — Encounter (HOSPITAL_COMMUNITY): Payer: Self-pay | Admitting: Obstetrics and Gynecology

## 2019-02-15 ENCOUNTER — Emergency Department (HOSPITAL_COMMUNITY)
Admission: EM | Admit: 2019-02-15 | Discharge: 2019-02-15 | Disposition: A | Discharge status: Home / Self Care | Payer: 59 | Attending: Emergency Medicine | Admitting: Emergency Medicine

## 2019-02-15 DIAGNOSIS — M3390 Dermatopolymyositis, unspecified, organ involvement unspecified: Secondary | ICD-10-CM

## 2019-02-15 DIAGNOSIS — R748 Abnormal levels of other serum enzymes: Secondary | ICD-10-CM | POA: Diagnosis present

## 2019-02-15 DIAGNOSIS — M3310 Other dermatopolymyositis, organ involvement unspecified: Secondary | ICD-10-CM | POA: Insufficient documentation

## 2019-02-15 DIAGNOSIS — R319 Hematuria, unspecified: Secondary | ICD-10-CM | POA: Diagnosis not present

## 2019-02-15 DIAGNOSIS — M6282 Rhabdomyolysis: Secondary | ICD-10-CM | POA: Diagnosis not present

## 2019-02-15 LAB — CK TOTAL AND CKMB (NOT AT ARMC)
CK, MB: 38.7 ng/mL — ABNORMAL HIGH (ref 0.5–5.0)
Relative Index: 1.2 (ref 0.0–2.5)
Total CK: 3354 U/L — ABNORMAL HIGH (ref 38–234)

## 2019-02-15 LAB — URINALYSIS, ROUTINE W REFLEX MICROSCOPIC
BILIRUBIN URINE: NEGATIVE
Glucose, UA: NEGATIVE mg/dL
Hgb urine dipstick: NEGATIVE
Ketones, ur: NEGATIVE mg/dL
Leukocytes,Ua: NEGATIVE
Nitrite: NEGATIVE
Protein, ur: NEGATIVE mg/dL
SPECIFIC GRAVITY, URINE: 1.013 (ref 1.005–1.030)
pH: 5 (ref 5.0–8.0)

## 2019-02-15 LAB — SEDIMENTATION RATE: Sed Rate: 29 mm/hr — ABNORMAL HIGH (ref 0–22)

## 2019-02-15 MED ORDER — SODIUM CHLORIDE 0.9 % IV SOLN: INTRAVENOUS | Status: DC

## 2019-02-15 MED ORDER — SODIUM CHLORIDE 0.9 % IV BOLUS
1000.0000 mL | Freq: Once | INTRAVENOUS | Status: AC
  Administered 2019-02-15: 1000 mL via INTRAVENOUS

## 2019-02-15 NOTE — Discharge Instructions (Addendum)
Symptoms suggestive of dermatomyositis however not necessarily confirmed.  As we discussed your total CK was less than 5000 done earlier today.  We tried to follow that up with a total CK and CK-MB to get a feeling of the amount of CK millimeters you have.  But that will not be back till later.  Also C-reactive protein is pending.  Did give you fluids today.  No reason for admission with CK less than 5000.  EKG also had no conduction abnormalities.  Also did a sed rate and that was normal.  Would recommend follow-up with your primary care provider consideration may be for neurology, rheumatology, or dermatology.

## 2019-02-15 NOTE — ED Provider Notes (Signed)
[facility] DEPT Provider Note   CSN: [id number] Arrival date & time: 02/15/19  1443    History   Chief Complaint Chief Complaint  Patient presents with  . 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     Patient sent in by primary care doctor.  For elevated CK.  Patient has been running in the upper 2000's starting about a week ago.  Now is slightly over 3000 these were done today.  Also had mild elevations in AST and ALT.  Kidney function was normal.  Patient apparently is been having hemoglobin in her urine for a while.  Going back to February.  There was some thought maybe it was due to urinary tract infection but that is now cleared and also has been seen by urology.  Without any specific findings.  No evidence or history of kidney stones as well.  Patient talks about some achy muscles in the thigh with some discomfort but no weakness.  Also some sort of achy skin findings.  More so in the anterior chest area.  They thought that perhaps some of this was due to to an allergic reaction to some of the medications.  No fever no upper respiratory symptoms.  No gross blood in the urine.  No exposure as far as the patient knows to coronavirus and has not been in any high risk areas.     Past Medical History:  Diagnosis Date  . Allergy   . Anemia   . Blood transfusion without reported diagnosis   . Cystitis, interstitial   . Fibroids     Patient Active Problem List   Diagnosis Date Noted  . Fibroid, uterine   . Uterine fibroid 03/07/2014  . Fibroids 03/07/2014  . Leiomyoma of body of uterus 02/20/2014    Past Surgical History:  Procedure Laterality Date  . MYOMECTOMY       OB History   No obstetric history on file.      Home Medications    Prior to Admission medications   Medication Sig Start Date End Date Taking? Authorizing Provider  hydrocortisone (PROCTO-MED HC) 2.5 % rectal cream Place 1 application rectally 2 (two)  times daily.   Yes [provider]  hydrocortisone cream 1 % Apply 1 application topically 2 (two) times daily as needed for itching (rash).   Yes [provider]  ibuprofen (ADVIL,MOTRIN) 200 MG tablet Take 400 mg by mouth every 4 (four) hours as needed for moderate pain.   Yes [provider]  naproxen sodium (ALEVE) 220 MG tablet Take 440 mg by mouth every 4 (four) hours as needed (pain).   Yes [provider]  cetirizine (ZYRTEC) 10 MG tablet Take 1 tablet (10 mg total) by mouth daily. Patient not taking: Reported on 08/21/2016 02/14/15   [name], [name]  EPINEPHrine (ADRENALIN) 1 MG/ML injection Inject 1 mL (1 mg total) into the muscle once. If used, go directly to ED. Patient not taking: Reported on 08/21/2016 02/14/15   [name], [name]  fluticasone [facility]) 50 MCG/ACT nasal spray Place 2 sprays into both nostrils daily. Patient not taking: Reported on 02/15/2019 04/05/15   Brewington, [name], [name]  hydrOXYzine (ATARAX/VISTARIL) 25 MG tablet Take 0.5-1 tablets (12.5-25 mg total) by mouth every 8 (eight) hours as needed for anxiety or itching. Patient not taking: Reported on 02/15/2019 02/14/15   [name], [name]  nitrofurantoin, macrocrystal-monohydrate, (MACROBID) 100 MG capsule Take  1 capsule (100 mg total) by mouth 2 (two) times daily. Patient not taking: Reported on 02/15/2019 01/09/19   [name], NP  olopatadine (PATANOL) 0.1 % ophthalmic solution Place 1 drop into both eyes 2 (two) times daily. Patient not taking: Reported on 02/15/2019 04/05/15   Brewington, [name], [name]  omeprazole (PRILOSEC) 20 MG capsule Take 2 capsules (40 mg total) by mouth 2 (two) times daily before a meal. Patient not taking: Reported on 02/15/2019 08/21/16   Schorr, [name], NP  phenazopyridine (PYRIDIUM) 200 MG tablet Take 1 tablet (200 mg total) by mouth 3 (three) times daily. Patient not taking: Reported on 02/15/2019 01/09/19   [name], NP   Phenylephrine-Chlorphen-DM 08-27-11.5 MG/5ML LIQD Take 5 mLs by mouth every 4 (four) hours as needed. Patient not taking: Reported on 02/15/2019 08/10/16   [name], NP  predniSONE (DELTASONE) 20 MG tablet Take 3 PO QAM x3days, 2 PO QAM x3days, 1 PO QAM x3days Patient not taking: Reported on 08/21/2016 02/14/15   [name], [name]  ranitidine (ZANTAC) 150 MG tablet Take 1 tablet (150 mg total) by mouth 2 (two) times daily. Patient not taking: Reported on 02/15/2019 02/15/15   [name], [name]    Family History Family History  Problem Relation Age of Onset  . Uterine cancer Mother   . Colon cancer Father   . Diabetes Brother   . Hyperlipidemia Sister   . Hyperlipidemia Brother   . Breast cancer Cousin        x2, both in their 65's    Social History Social History   Tobacco Use  . Smoking status: Never Smoker  . Smokeless tobacco: Never Used  Substance Use Topics  . Alcohol use: No    Alcohol/week: 0.0 standard drinks  . Drug use: No     Allergies   Azo [phenazopyridine]; Uribel [urelle]; Multihance [gadobenate]; and Bactrim [sulfamethoxazole-trimethoprim]   Review of Systems Review of Systems  Constitutional: Negative for chills, diaphoresis and fever.  HENT: Negative for rhinorrhea and sore throat.   Eyes: Negative for photophobia, redness and visual disturbance.  Respiratory: Negative for cough and shortness of breath.   Cardiovascular: Negative for chest pain and leg swelling.  Gastrointestinal: Negative for abdominal pain, diarrhea, nausea and vomiting.  Genitourinary: Positive for hematuria. Negative for difficulty urinating and dysuria.  Musculoskeletal: Positive for myalgias. Negative for back pain and neck pain.  Skin: Negative for rash.  Neurological: Negative for dizziness, tremors, syncope, facial asymmetry, weakness, light-headedness, numbness and headaches.  Hematological: Does not bruise/bleed easily.  Psychiatric/Behavioral: Negative for  confusion.     Physical Exam Updated Vital Signs BP (!) 136/92   Pulse 95   Resp 15   SpO2 97%   Physical Exam Vitals signs and nursing note reviewed.  Constitutional:      General: She is not in acute distress.    Appearance: Normal appearance. She is well-developed. She is not ill-appearing, toxic-appearing or diaphoretic.  HENT:     Head: Normocephalic and atraumatic.     Nose: No congestion.     Mouth/Throat:     Mouth: Mucous membranes are moist.  Eyes:     Extraocular Movements: Extraocular movements intact.     Conjunctiva/sclera: Conjunctivae normal.     Pupils: Pupils are equal, round, and reactive to light.  Neck:     Musculoskeletal: Normal range of motion and neck supple.  Cardiovascular:     Rate and Rhythm: Normal rate and regular rhythm.  Heart sounds: Normal heart sounds. No murmur.  Pulmonary:     Effort: Pulmonary effort is normal. No respiratory distress.     Breath sounds: Normal breath sounds.  Abdominal:     General: Bowel sounds are normal.     Palpations: Abdomen is soft.     Tenderness: There is no abdominal tenderness.  Musculoskeletal: Normal range of motion.        General: No tenderness.  Skin:    General: Skin is warm and dry.     Capillary Refill: Capillary refill takes less than 2 seconds.     Findings: No rash.  Neurological:     General: No focal deficit present.     Mental Status: She is alert and oriented to person, place, and time.     Cranial Nerves: No cranial nerve deficit.     Sensory: No sensory deficit.     Motor: No weakness.      ED Treatments / Results  Labs (all labs ordered are listed, but only abnormal results are displayed) Labs Reviewed  SEDIMENTATION RATE - Abnormal; Notable for the following components:      Result Value   Sed Rate 29 (*)    All other components within normal limits  URINALYSIS, ROUTINE W REFLEX MICROSCOPIC  C-REACTIVE PROTEIN  CK TOTAL AND CKMB (NOT AT [facility])    EKG EKG  Interpretation  Date/Time:  Wednesday February 15 2019 18:18:58 EDT Ventricular Rate:  88 PR Interval:    QRS Duration: 80 QT Interval:  355 QTC Calculation: 430 R Axis:   49 Text Interpretation:  Sinus rhythm No previous ECGs available Confirmed by Fredia Sorrow [phone]) on 02/15/2019 6:27:27 PM   Radiology No results found.  Procedures Procedures (including critical care time)  Medications Ordered in ED Medications  0.9 %  sodium chloride infusion ( Intravenous Not Given 02/15/19 2037)  sodium chloride 0.9 % bolus 1,000 mL (0 mLs Intravenous Stopped 02/15/19 2010)     Initial Impression / Assessment and Plan / ED Course  I have reviewed the triage vital signs and the nursing notes.  Pertinent labs & imaging results that were available during my care of the patient were reviewed by me and considered in my medical decision making (see chart for details).        Patient sent in by primary care provider with concerns for elevated CK.  At was slightly over 3000.  But patient had an elevation for the past week and no evidence of trauma or crush injury.  Patient with some skin condition as well as some muscle aches predominantly in the thigh symptoms may very well be consistent with a dermatomyositis.  Ordered total CK and CK MB but that is not back yet.  Did get a sed rate which was normal.  Also ordered C-reactive protein which is not back yet.  EKG without acute abnormalities.  Patient was given IV fluids 1 L normal saline and also a rate of 100.  This will give some protection to the kidneys.  But as long as the CK is below 5000 and there should be no renal injury.  Patient's renal function done earlier today was normal.  EKG without any abnormalities in particular no conduction abnormalities.  Urinalysis today negative for any hemoglobin.  In addition despite the muscle soreness in the thighs.  There was no weakness.  Patient cannot follow back up with primary care provider.   Consideration for evaluation by neurology perhaps rheumatology and perhaps dermatology.  For CKs below 5000 there should not be any concern for any kidney injury.    It was interesting that based on the patient's muscle symptoms that the sed rate was extremely normal.  Patient nontoxic no acute distress.  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

## 2019-02-15 NOTE — ED Triage Notes (Signed)
Patient reports she is being sent from her primary care doctor's office stating that her CPK is over 3,000.  Patient reports she is concerned about muscle wasting

## 2019-02-16 LAB — C-REACTIVE PROTEIN: CRP: <0.8 mg/dL (ref <1.0)

## 2019-02-21 DIAGNOSIS — R748 Abnormal levels of other serum enzymes: Secondary | ICD-10-CM | POA: Diagnosis not present

## 2019-02-28 ENCOUNTER — Other Ambulatory Visit: Payer: Self-pay | Admitting: Physician Assistant

## 2019-02-28 DIAGNOSIS — R748 Abnormal levels of other serum enzymes: Secondary | ICD-10-CM

## 2019-03-07 ENCOUNTER — Ambulatory Visit
Admission: RE | Admit: 2019-03-07 | Discharge: 2019-03-07 | Disposition: A | Discharge status: Home / Self Care | Payer: 59 | Source: Ambulatory Visit | Attending: Physician Assistant | Admitting: Physician Assistant

## 2019-03-07 ENCOUNTER — Other Ambulatory Visit: Payer: Self-pay

## 2019-03-07 DIAGNOSIS — R748 Abnormal levels of other serum enzymes: Secondary | ICD-10-CM

## 2019-03-09 ENCOUNTER — Other Ambulatory Visit: Payer: Self-pay | Admitting: Physician Assistant

## 2019-03-09 DIAGNOSIS — R748 Abnormal levels of other serum enzymes: Secondary | ICD-10-CM

## 2019-03-17 ENCOUNTER — Other Ambulatory Visit: Payer: Self-pay

## 2019-03-17 ENCOUNTER — Ambulatory Visit
Admission: RE | Admit: 2019-03-17 | Discharge: 2019-03-17 | Disposition: A | Discharge status: Home / Self Care | Payer: 59 | Source: Ambulatory Visit | Attending: Physician Assistant | Admitting: Physician Assistant

## 2019-03-17 DIAGNOSIS — M79605 Pain in left leg: Secondary | ICD-10-CM | POA: Diagnosis not present

## 2019-03-17 DIAGNOSIS — R748 Abnormal levels of other serum enzymes: Secondary | ICD-10-CM

## 2019-03-17 DIAGNOSIS — M79604 Pain in right leg: Secondary | ICD-10-CM | POA: Diagnosis not present

## 2019-03-28 DIAGNOSIS — R748 Abnormal levels of other serum enzymes: Secondary | ICD-10-CM | POA: Diagnosis not present

## 2019-03-28 DIAGNOSIS — R7989 Other specified abnormal findings of blood chemistry: Secondary | ICD-10-CM | POA: Diagnosis not present

## 2019-08-28 IMAGING — US US AXILLARY RIGHT
1 series · 2 of 2 positions shown · non-contrast
Comparison: Previous exam(s).

CLINICAL DATA: 52-year-old female with palpable thickening in the
RIGHT axilla identified on self-examination. Also for annual
bilateral mammogram.

EXAM:
DIGITAL DIAGNOSTIC BILATERAL MAMMOGRAM WITH CAD AND TOMO
ULTRASOUND RIGHT AXILLA

[Series 1: us axillary right · 0.07mm/px · 2 of 2 slices shown]
[im 1/2]
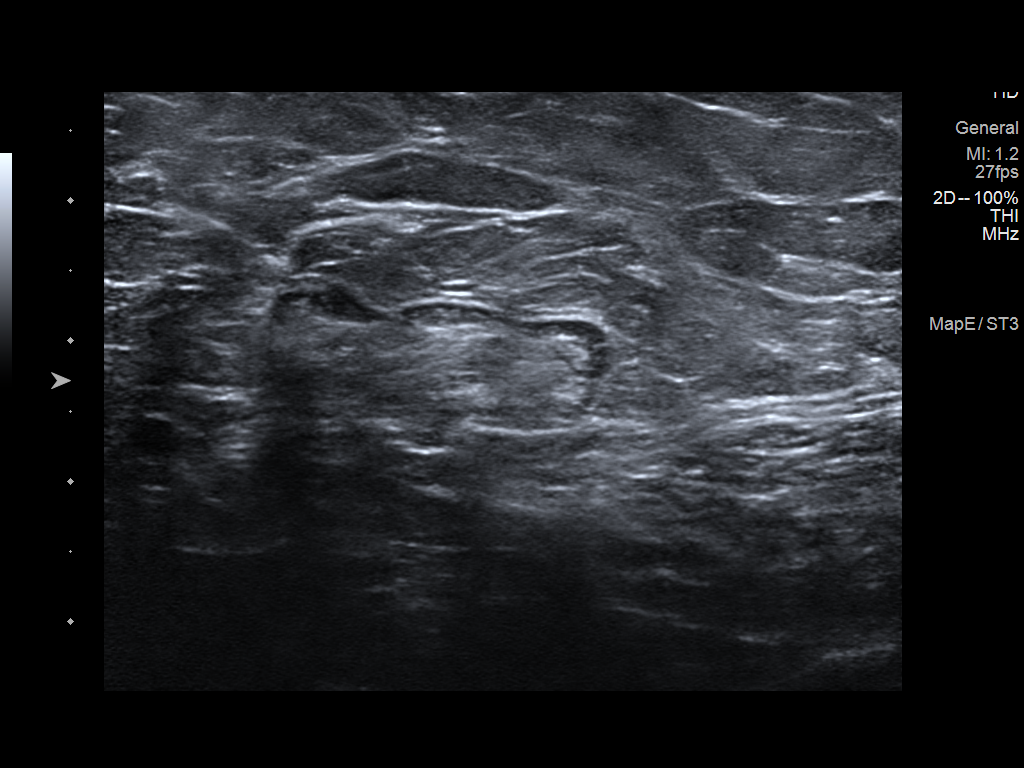
[im 2/2]
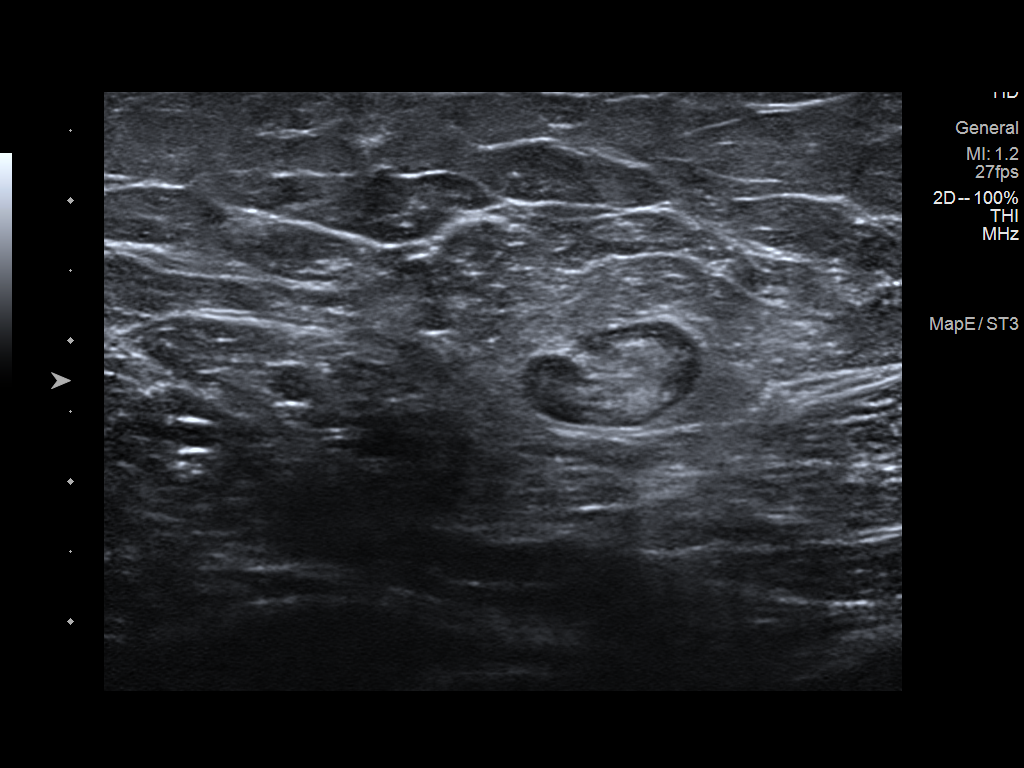

[2 of 2 positions shown; findings below may reference images not displayed]

ACR Breast Density Category b: There are scattered areas of
fibroglandular density.
FINDINGS: 2D/3D full field views of both breast demonstrate no suspicious mass
or worrisome calcifications.

INNER RIGHT breast asymmetry is unchanged from 2889.

Mammographic images were processed with CAD.

On physical exam, no palpable abnormalities are identified within
the UPPER-OUTER RIGHT breast or RIGHT axilla.

Targeted ultrasound is performed, showing no solid or cystic mass,
distortion, abnormal shadowing or abnormal lymph nodes within the
UPPER-OUTER RIGHT breast or RIGHT axilla.
IMPRESSION: 1. No mammographic, palpable or sonographic abnormality within the
UPPER-OUTER RIGHT breast or RIGHT axilla, in the area of patient
concern.
2. No mammographic evidence of breast malignancy.

RECOMMENDATION:
Bilateral screening mammogram in 1 year.

Clinical follow-up recommended for the area of concern in the RIGHT
breast/axilla. Any further workup should be based on clinical
grounds.

I have discussed the findings and recommendations with the patient.
Results were also provided in writing at the conclusion of the
visit. If applicable, a reminder letter will be sent to the patient
regarding the next appointment.

BI-RADS CATEGORY  1: Negative.

## 2019-10-06 ENCOUNTER — Other Ambulatory Visit: Payer: Self-pay | Admitting: Family Medicine

## 2019-10-06 DIAGNOSIS — Z1231 Encounter for screening mammogram for malignant neoplasm of breast: Secondary | ICD-10-CM

## 2019-11-30 ENCOUNTER — Other Ambulatory Visit: Payer: Self-pay

## 2019-11-30 ENCOUNTER — Ambulatory Visit
Admission: RE | Admit: 2019-11-30 | Discharge: 2019-11-30 | Disposition: A | Discharge status: Home / Self Care | Payer: 59 | Source: Ambulatory Visit | Attending: Family Medicine | Admitting: Family Medicine

## 2019-11-30 DIAGNOSIS — Z1231 Encounter for screening mammogram for malignant neoplasm of breast: Secondary | ICD-10-CM

## 2019-12-29 IMAGING — MR MR OF THE LEFT FEMUR WITHOUT CONTRAST
8 series · 40 of 40 positions shown · non-contrast
Comparison: Limited correlation made with pelvic CT 02/02/2019.
Study was performed in conjunction with an MRI of the opposite
thigh.

CLINICAL DATA: Bilateral upper thigh pain with elevated creatinine
Martine levels. Evaluate for inflammatory myositis.

EXAM:
MR OF THE LEFT FEMUR WITHOUT CONTRAST
TECHNIQUE: Multiplanar, multisequence MR imaging of the left thigh was
performed. No intravenous contrast was administered.

[Series 4: T1 · coronal · 4.0mm · 1.34mm/px · 3 of 34 slices shown (1 of 3)]
[im 1/34]
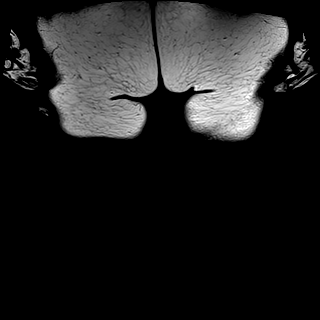
[im 17/34]
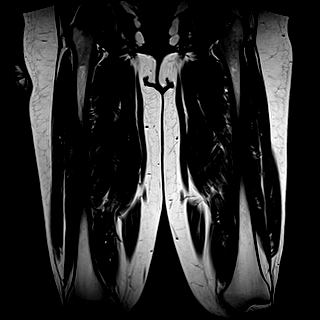
[im 34/34]
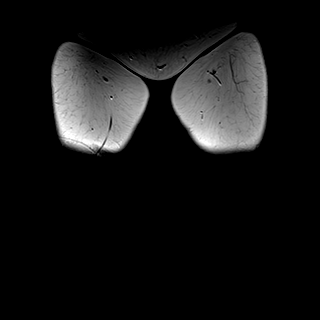

[Series 5: STIR · coronal · 4.0mm · 1.34mm/px · 3 of 34 slices shown (1 of 5)]
[im 1/34]
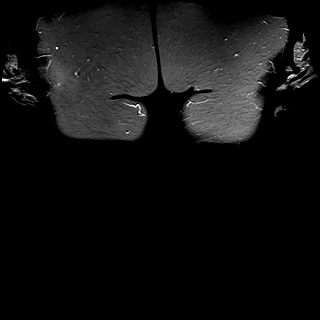
[im 17/34]
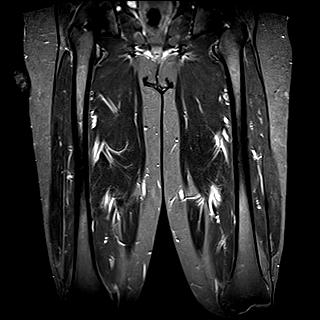
[im 34/34]
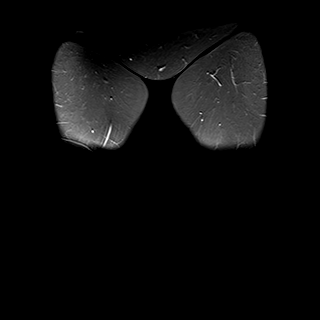

[Series 7: T1 · axial · 5.0mm · 0.74mm/px · z∈[-270,+81]mm · 6 of 55 slices shown (2 of 3)]
[im 1/55]
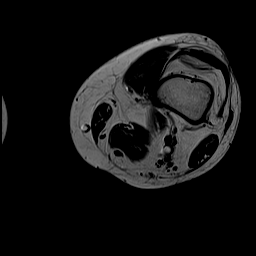
[im 11/55]
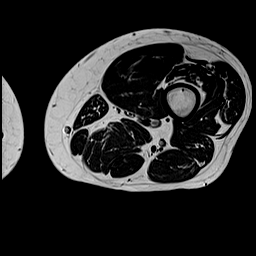
[im 22/55]
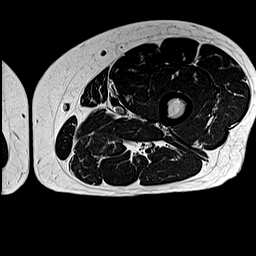
[im 33/55]
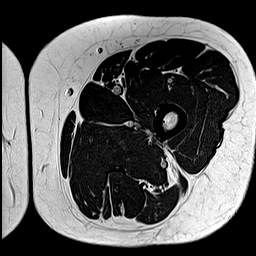
[im 44/55]
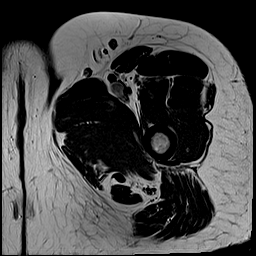
[im 55/55]
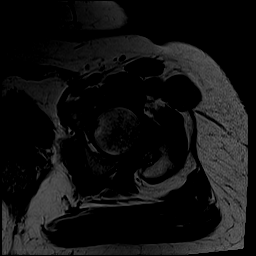

[Series 8: STIR · sagittal · 3.5mm · 1.56mm/px · 5 of 42 slices shown (2 of 5)]
[im 1/42]
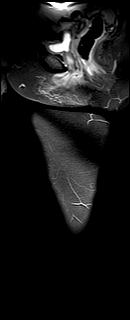
[im 11/42]
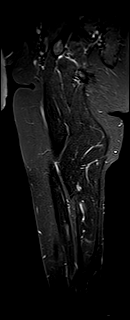
[im 21/42]
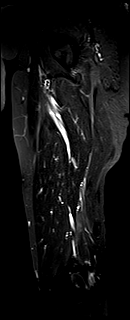
[im 31/42]
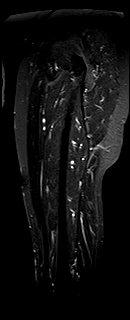
[im 42/42]
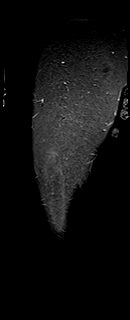

[Series 9: STIR · axial · 5.0mm · 0.59mm/px · z∈[-270,+81]mm · 6 of 55 slices shown (3 of 5)]
[im 1/55]
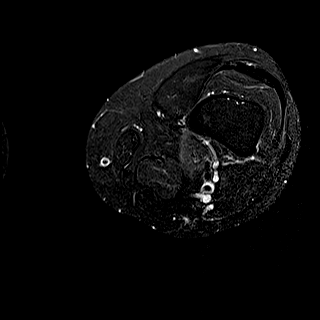
[im 11/55]
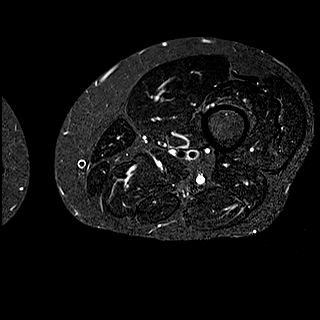
[im 22/55]
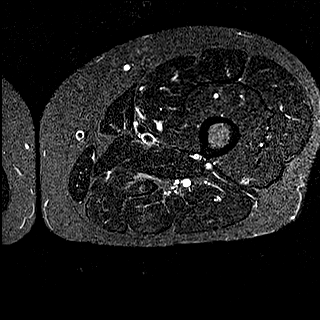
[im 33/55]
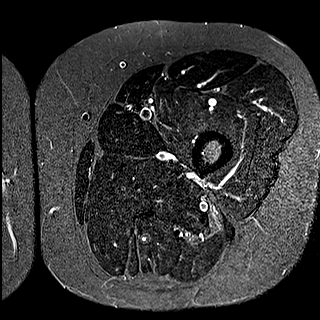
[im 44/55]
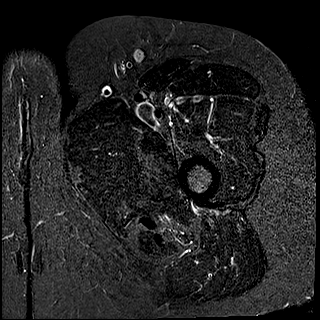
[im 55/55]
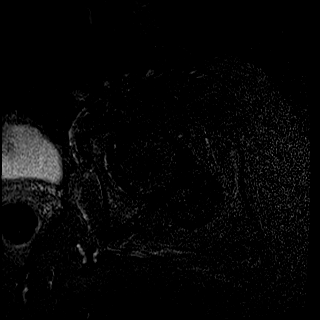

[Series 10: T1 · axial · 5.0mm · 0.74mm/px · z∈[-267,+84]mm · 6 of 55 slices shown (3 of 3)]
[im 1/55]
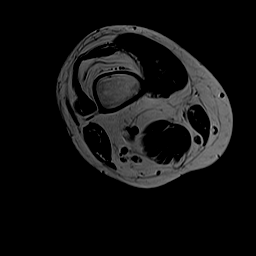
[im 11/55]
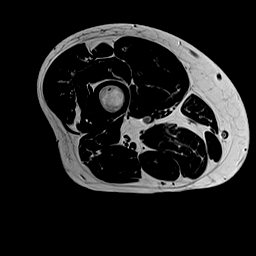
[im 22/55]
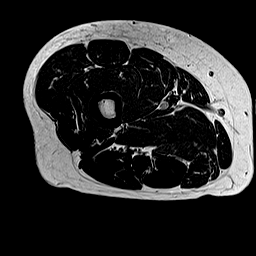
[im 33/55]
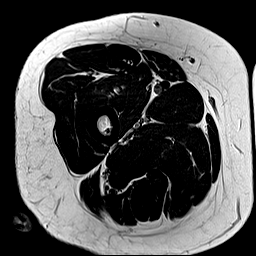
[im 44/55]
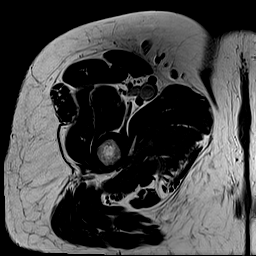
[im 55/55]
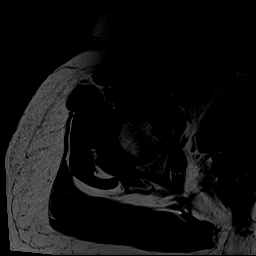

[Series 11: STIR · axial · 5.0mm · 0.59mm/px · z∈[-267,+84]mm · 6 of 55 slices shown (4 of 5)]
[im 1/55]
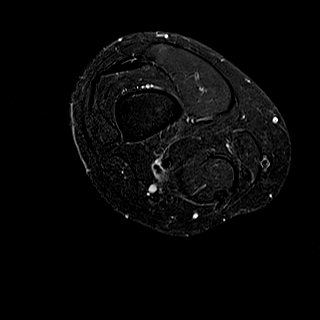
[im 11/55]
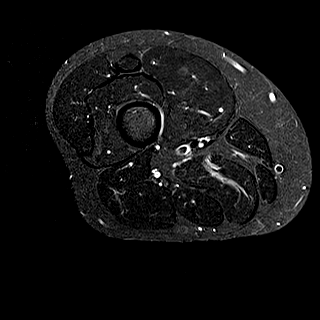
[im 22/55]
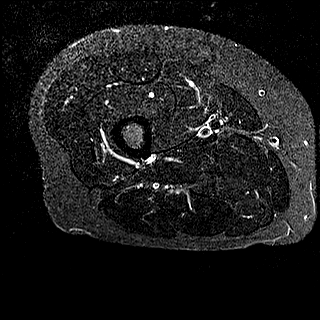
[im 33/55]
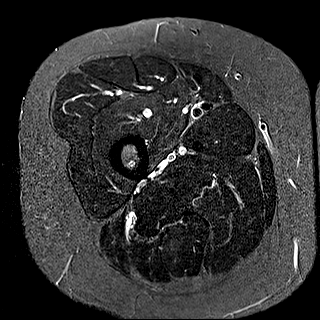
[im 44/55]
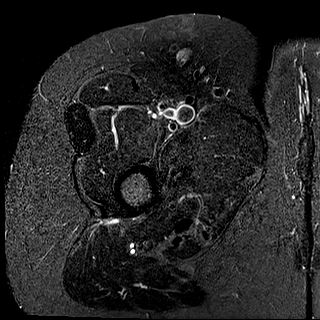
[im 55/55]
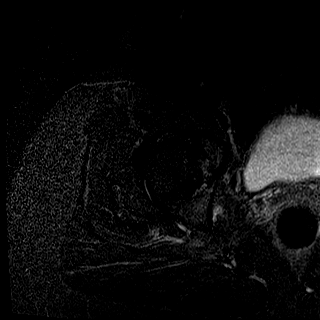

[Series 12: STIR · sagittal · 3.5mm · 1.56mm/px · 5 of 42 slices shown (5 of 5)]
[im 1/42]
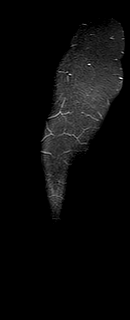
[im 11/42]
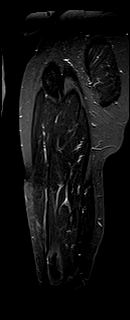
[im 21/42]
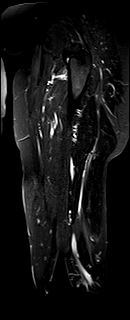
[im 31/42]
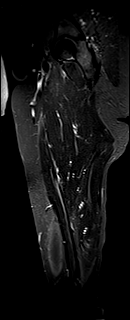
[im 42/42]
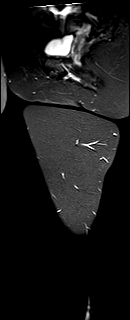

[40 of 40 positions shown; findings below may reference images not displayed]

FINDINGS: Bones/Joint/Cartilage

The left femur appears normal. The visualized left hip and knee
joints appear normal without significant effusions.

Ligaments

Not relevant for exam/indication.

Muscles and Tendons

The left thigh musculature appears normal. Specifically, there is no
edema or fatty atrophy. Relative to the right thigh, the left thigh
muscles are symmetric. The common hamstring and quadriceps tendons
appear normal.

Soft tissues

Unremarkable. There is no soft tissue edema or fluid collection. No
vascular abnormalities are identified on noncontrast imaging.
IMPRESSION: Normal MRI of the left thigh. No evidence of myositis. Right thigh
findings dictated separately.

## 2020-04-22 ENCOUNTER — Encounter: Payer: Self-pay | Admitting: *Deleted

## 2020-04-22 ENCOUNTER — Encounter (HOSPITAL_BASED_OUTPATIENT_CLINIC_OR_DEPARTMENT_OTHER): Payer: Self-pay | Admitting: *Deleted

## 2020-04-22 ENCOUNTER — Ambulatory Visit
Admission: EM | Admit: 2020-04-22 | Discharge: 2020-04-22 | Disposition: A | Discharge status: Home / Self Care | Payer: 59 | Source: Home / Self Care

## 2020-04-22 ENCOUNTER — Other Ambulatory Visit: Payer: Self-pay

## 2020-04-22 ENCOUNTER — Emergency Department (HOSPITAL_BASED_OUTPATIENT_CLINIC_OR_DEPARTMENT_OTHER)
Admission: EM | Admit: 2020-04-22 | Discharge: 2020-04-22 | Disposition: A | Discharge status: Home / Self Care | Payer: 59 | Attending: Emergency Medicine | Admitting: Emergency Medicine

## 2020-04-22 DIAGNOSIS — R599 Enlarged lymph nodes, unspecified: Secondary | ICD-10-CM

## 2020-04-22 DIAGNOSIS — Z79899 Other long term (current) drug therapy: Secondary | ICD-10-CM | POA: Diagnosis not present

## 2020-04-22 DIAGNOSIS — L989 Disorder of the skin and subcutaneous tissue, unspecified: Secondary | ICD-10-CM | POA: Diagnosis present

## 2020-04-22 DIAGNOSIS — R591 Generalized enlarged lymph nodes: Secondary | ICD-10-CM

## 2020-04-22 MED ORDER — DEXAMETHASONE SODIUM PHOSPHATE 10 MG/ML IJ SOLN
10.0000 mg | Freq: Once | INTRAMUSCULAR | Status: AC
  Administered 2020-04-22: 10 mg via INTRAMUSCULAR
  Filled 2020-04-22: qty 1

## 2020-04-22 MED ORDER — NAPROXEN 500 MG PO TABS: 500.0000 mg | ORAL_TABLET | Freq: Two times a day (BID) | ORAL | 0 refills | Status: AC

## 2020-04-22 NOTE — ED Triage Notes (Signed)
Pt reports noticing 3 small lumps to right scalp onset 2 days ago.  States she believes she is having a reaction to new hair products.

## 2020-04-22 NOTE — ED Provider Notes (Signed)
[name]    CSN: [id number] Arrival date & time: 04/22/20  1227      History   Chief Complaint Chief Complaint  Patient presents with  . Scalp lumps    HPI Teresa Preston is a 54 y.o. female.   HPI  Patient presents today for evaluation of 3 small , tender nodules, to the right scalp which developed 2 days ago. She is concerned that she is having a reaction to a new hair care product that she is tried. The nodules are under the scalp, no blistering or drainage present. She reports increasing tenderness to the point their is pain with moving and touching her neck. This is a new problem. Denies any recent illness or respiratory symptoms. She has not taken anything for the symptoms or pain. Past Medical History:  Diagnosis Date  . Allergy   . Anemia   . Blood transfusion without reported diagnosis   . Cystitis, interstitial   . Fibroids     Patient Active Problem List   Diagnosis Date Noted  . Fibroid, uterine   . Uterine fibroid 03/07/2014  . Fibroids 03/07/2014  . Leiomyoma of body of uterus 02/20/2014    Past Surgical History:  Procedure Laterality Date  . MYOMECTOMY    . UTERINE FIBROID SURGERY      OB History   No obstetric history on file.      Home Medications    Prior to Admission medications   Medication Sig Start Date End Date Taking? Authorizing Provider  HYDROCHLOROTHIAZIDE PO Take by mouth. Prn for hand swelling   Yes [provider]  cetirizine (ZYRTEC) 10 MG tablet Take 1 tablet (10 mg total) by mouth daily. Patient not taking: Reported on 08/21/2016 02/14/15   [name], [name]  EPINEPHrine (ADRENALIN) 1 MG/ML injection Inject 1 mL (1 mg total) into the muscle once. If used, go directly to ED. Patient not taking: Reported on 08/21/2016 02/14/15   [name], [name]  fluticasone [facility]) 50 MCG/ACT nasal spray Place 2 sprays into both nostrils daily. Patient not taking: Reported on 02/15/2019 04/05/15   Brewington,  [name], [name]  hydrocortisone (PROCTO-MED HC) 2.5 % rectal cream Place 1 application rectally 2 (two) times daily.    [provider]  hydrocortisone cream 1 % Apply 1 application topically 2 (two) times daily as needed for itching (rash).    [provider]  hydrOXYzine (ATARAX/VISTARIL) 25 MG tablet Take 0.5-1 tablets (12.5-25 mg total) by mouth every 8 (eight) hours as needed for anxiety or itching. Patient not taking: Reported on 02/15/2019 02/14/15   [name], [name]  ibuprofen (ADVIL,MOTRIN) 200 MG tablet Take 400 mg by mouth every 4 (four) hours as needed for moderate pain.    [provider]  naproxen sodium (ALEVE) 220 MG tablet Take 440 mg by mouth every 4 (four) hours as needed (pain).    [provider]  nitrofurantoin, macrocrystal-monohydrate, (MACROBID) 100 MG capsule Take 1 capsule (100 mg total) by mouth 2 (two) times daily. Patient not taking: Reported on 02/15/2019 01/09/19   [name], NP  olopatadine (PATANOL) 0.1 % ophthalmic solution Place 1 drop into both eyes 2 (two) times daily. Patient not taking: Reported on 02/15/2019 04/05/15   Brewington, [name], [name]  omeprazole (PRILOSEC) 20 MG capsule Take 2 capsules (40 mg total) by mouth 2 (two) times daily before a meal. Patient not taking: Reported on 02/15/2019 08/21/16   Schorr, [name], NP  phenazopyridine (PYRIDIUM) 200 MG tablet Take 1 tablet (200 mg total) by mouth 3 (three) times daily. Patient not taking: Reported on 02/15/2019 01/09/19   [name], NP  Phenylephrine-Chlorphen-DM 08-27-11.5 MG/5ML LIQD Take 5 mLs by mouth every 4 (four) hours as needed. Patient not taking: Reported on 02/15/2019 08/10/16   [name], NP  predniSONE (DELTASONE) 20 MG tablet Take 3 PO QAM x3days, 2 PO QAM x3days, 1 PO QAM x3days Patient not taking: Reported on 08/21/2016 02/14/15   [name], [name]  ranitidine (ZANTAC) 150 MG tablet Take 1 tablet (150 mg total) by mouth 2 (two) times  daily. Patient not taking: Reported on 02/15/2019 02/15/15   [name], [name]    Family History Family History  Problem Relation Age of Onset  . Uterine cancer Mother   . Colon cancer Father   . Diabetes Brother   . Hyperlipidemia Sister   . Hyperlipidemia Brother   . Breast cancer Cousin        x2, both in their 25's    Social History Social History   Tobacco Use  . Smoking status: Never Smoker  . Smokeless tobacco: Never Used  Substance Use Topics  . Alcohol use: No  . Drug use: No     Allergies   Azo [phenazopyridine], Uribel [urelle], Multihance [gadobenate], and Bactrim [sulfamethoxazole-trimethoprim]   Review of Systems Review of Systems Pertinent negatives listed in HPI  Physical Exam Triage Vital Signs ED Triage Vitals  Enc Vitals Group     BP 04/22/20 1321 126/89     Pulse Rate 04/22/20 1321 90     Resp 04/22/20 1321 16     Temp 04/22/20 1321 98 F (36.7 C)     Temp Source 04/22/20 1321 Oral     SpO2 04/22/20 1321 96 %     Weight --      Height --      Head Circumference --      Peak Flow --      Pain Score 04/22/20 1322 5     Pain Loc --      Pain Edu? --      Excl. in [place]? --    No data found.  Updated Vital Signs BP 126/89   Pulse 90   Temp 98 F (36.7 C) (Oral)   Resp 16   LMP 05/21/2015 (Exact Date)   SpO2 96%   Visual Acuity Right Eye Distance:   Left Eye Distance:   Bilateral Distance:    Right Eye Near:   Left Eye Near:    Bilateral Near:     Physical Exam HENT:     Head: Normocephalic.   Cardiovascular:     Rate and Rhythm: Normal rate.  Pulmonary:     Effort: Pulmonary effort is normal.     Breath sounds: Normal breath sounds.  Musculoskeletal:     Cervical back: Pain with movement present. Decreased range of motion.  Lymphadenopathy:     Cervical: Cervical adenopathy present.     Right cervical: Superficial cervical adenopathy and posterior cervical adenopathy present.  Skin:    General: Skin is warm.    Psychiatric:        Attention and Perception: Attention normal.        Mood and Affect: Mood normal.    UC Treatments / Results  Labs (all labs ordered are listed, but only abnormal results are displayed) Labs Reviewed - No data to display  EKG   Radiology No  results found.  Procedures Procedures (including critical care time)  Medications Ordered in UC Medications - No data to display  Initial Impression / Assessment and Plan / UC Course  I have reviewed the triage vital signs and the nursing notes.  Pertinent labs & imaging results that were available during my care of the patient were reviewed by me and considered in my medical decision making (see chart for details).     Acute onset adenopathy, of unknown etiology. Given the degree of discomfort and no identifiable cause for immune reaction, recommended further evaluation in the ER for possible blood work and CT of the head and neck. Patient agreed with plan and will follow-up today. Final Clinical Impressions(s) / UC Diagnoses   Final diagnoses:  Adenopathy, right cervical neck and right head     Discharge Instructions     Go immediately to the Emergency Department  [address] [place] [phone]  [phone]  Go immediately for further work-up and evaluation to [facility] given the extent of the swelling nodules palpable to the neck and head.  Unfortunately I have no diagnostic tools available here at urgent care today to determine the underlying cause of symptoms.    ED Prescriptions    None     PDMP not reviewed this encounter.   [place], Siuda, FNP 04/23/20 1904

## 2020-04-22 NOTE — Discharge Instructions (Addendum)
Take naproxen 2 times a day with meals.  Do not take other anti-inflammatories at the same time (Advil, Motrin, ibuprofen, Aleve). You may supplement with Tylenol if you need further pain control. Use ice packs to help with pain and swelling.  Follow up with your primary care doctor in the next 1-2 week for recheck.  Return to the ER with any new, worsening, or concerning symptoms.

## 2020-04-22 NOTE — Discharge Instructions (Addendum)
Go immediately to the Emergency Department  [address] [place] [phone]  [phone]  Go immediately for further work-up and evaluation to [facility] given the extent of the swelling nodules palpable to the neck and head.  Unfortunately I have no diagnostic tools available here at urgent care today to determine the underlying cause of symptoms.

## 2020-04-22 NOTE — ED Triage Notes (Signed)
She was seen at [facility] for 3 small lumps on the right scalp x 2 days. States she believes she is having a reaction to a hair product.

## 2020-04-22 NOTE — ED Provider Notes (Addendum)
[place] EMERGENCY DEPARTMENT Provider Note   CSN: [id number] Arrival date & time: 04/22/20  1435     History Chief Complaint  Patient presents with  . Lymphadenopathy    Teresa Preston is a 54 y.o. female presenting for evaluation of painful bumps on the scalp.  Patient states 2 days ago, she noticed 3 painful bumps on the right side of her scalp.  She is also having some soreness on the right side of her neck.  She thought this was a reaction to her hair dye/shampoo.  She denies recent fevers, chills, ear pain, nasal congestion, sore throat, dental pain, cough, chest pain, shortness of breath.  She states she has no medical problems, takes no medications daily.  She denies weight loss, decreased appetite, fatigue, or night sweats.  She denies neck pain or stiffness.  She has not taken anything for her symptoms including Tylenol or ibuprofen.  HPI     Past Medical History:  Diagnosis Date  . Allergy   . Anemia   . Blood transfusion without reported diagnosis   . Cystitis, interstitial   . Fibroids     Patient Active Problem List   Diagnosis Date Noted  . Fibroid, uterine   . Uterine fibroid 03/07/2014  . Fibroids 03/07/2014  . Leiomyoma of body of uterus 02/20/2014    Past Surgical History:  Procedure Laterality Date  . MYOMECTOMY    . UTERINE FIBROID SURGERY       OB History   No obstetric history on file.     Family History  Problem Relation Age of Onset  . Uterine cancer Mother   . Colon cancer Father   . Diabetes Brother   . Hyperlipidemia Sister   . Hyperlipidemia Brother   . Breast cancer Cousin        x2, both in their 24's    Social History   Tobacco Use  . Smoking status: Never Smoker  . Smokeless tobacco: Never Used  Substance Use Topics  . Alcohol use: No  . Drug use: No    Home Medications Prior to Admission medications   Medication Sig Start Date End Date Taking? Authorizing Provider  cetirizine (ZYRTEC) 10 MG tablet  Take 1 tablet (10 mg total) by mouth daily. Patient not taking: Reported on 08/21/2016 02/14/15   [name], [name]  EPINEPHrine (ADRENALIN) 1 MG/ML injection Inject 1 mL (1 mg total) into the muscle once. If used, go directly to ED. Patient not taking: Reported on 08/21/2016 02/14/15   [name], [name]  fluticasone [facility]) 50 MCG/ACT nasal spray Place 2 sprays into both nostrils daily. Patient not taking: Reported on 02/15/2019 04/05/15   Brewington, [name], [name]  HYDROCHLOROTHIAZIDE PO Take by mouth. Prn for hand swelling    [provider]  hydrocortisone (PROCTO-MED HC) 2.5 % rectal cream Place 1 application rectally 2 (two) times daily.    [provider]  hydrocortisone cream 1 % Apply 1 application topically 2 (two) times daily as needed for itching (rash).    [provider]  hydrOXYzine (ATARAX/VISTARIL) 25 MG tablet Take 0.5-1 tablets (12.5-25 mg total) by mouth every 8 (eight) hours as needed for anxiety or itching. Patient not taking: Reported on 02/15/2019 02/14/15   [name], [name]  ibuprofen (ADVIL,MOTRIN) 200 MG tablet Take 400 mg by mouth every 4 (four) hours as needed for moderate pain.    [provider]  naproxen (NAPROSYN) 500 MG tablet Take 1 tablet (  500 mg total) by mouth 2 (two) times daily with a meal for 7 days. 04/22/20 04/29/20  Kendalynn Wideman, [name]  nitrofurantoin, macrocrystal-monohydrate, (MACROBID) 100 MG capsule Take 1 capsule (100 mg total) by mouth 2 (two) times daily. Patient not taking: Reported on 02/15/2019 01/09/19   [name], NP  olopatadine (PATANOL) 0.1 % ophthalmic solution Place 1 drop into both eyes 2 (two) times daily. Patient not taking: Reported on 02/15/2019 04/05/15   Brewington, [name], [name]  omeprazole (PRILOSEC) 20 MG capsule Take 2 capsules (40 mg total) by mouth 2 (two) times daily before a meal. Patient not taking: Reported on 02/15/2019 08/21/16   Schorr, [name], NP  phenazopyridine  (PYRIDIUM) 200 MG tablet Take 1 tablet (200 mg total) by mouth 3 (three) times daily. Patient not taking: Reported on 02/15/2019 01/09/19   [name], NP  Phenylephrine-Chlorphen-DM 08-27-11.5 MG/5ML LIQD Take 5 mLs by mouth every 4 (four) hours as needed. Patient not taking: Reported on 02/15/2019 08/10/16   [name], NP  predniSONE (DELTASONE) 20 MG tablet Take 3 PO QAM x3days, 2 PO QAM x3days, 1 PO QAM x3days Patient not taking: Reported on 08/21/2016 02/14/15   [name], [name]  ranitidine (ZANTAC) 150 MG tablet Take 1 tablet (150 mg total) by mouth 2 (two) times daily. Patient not taking: Reported on 02/15/2019 02/15/15   [name], [name]    Allergies    Azo [phenazopyridine], Uribel [urelle], Multihance [gadobenate], and Bactrim [sulfamethoxazole-trimethoprim]  Review of Systems   Review of Systems  HENT:       3 tender lesions of scalp/posteior neck  All other systems reviewed and are negative.   Physical Exam Updated Vital Signs BP (!) 131/97   Pulse 98   Temp 98.4 F (36.9 C) (Oral)   Resp 18   Ht 5' (1.524 m)   Wt 76.7 kg   LMP 05/21/2015 (Exact Date)   SpO2 99%   BMI 33.02 kg/m   Physical Exam Vitals and nursing note reviewed.  Constitutional:      General: She is not in acute distress.    Appearance: She is well-developed.  HENT:     Head: Normocephalic and atraumatic.      Comments: 3 swollen tender lesions on the right side of the scalp/neck, postauricular, at the base of the skull, just above the ear.  Patient also with tenderness along the anterior cervical lymph node chain and supraclavicular lymph chains.  No erythema, fluctuance, or induration of any lesion. Eyes:     Extraocular Movements: Extraocular movements intact.     Conjunctiva/sclera: Conjunctivae normal.     Pupils: Pupils are equal, round, and reactive to light.  Cardiovascular:     Rate and Rhythm: Normal rate and regular rhythm.     Pulses: Normal pulses.  Pulmonary:      Effort: Pulmonary effort is normal.     Breath sounds: Normal breath sounds.  Abdominal:     General: There is no distension.     Palpations: There is no mass.     Tenderness: There is no abdominal tenderness. There is no guarding or rebound.  Musculoskeletal:        General: Normal range of motion.     Cervical back: Normal range of motion and neck supple. No rigidity or tenderness.     Right lower leg: No edema.     Left lower leg: No edema.  Skin:    General: Skin is warm.  Capillary Refill: Capillary refill takes less than 2 seconds.     Findings: No rash.  Neurological:     Mental Status: She is alert and oriented to person, place, and time.     ED Results / Procedures / Treatments   Labs (all labs ordered are listed, but only abnormal results are displayed) Labs Reviewed - No data to display  EKG None  Radiology No results found.  Procedures Procedures (including critical care time)  Medications Ordered in ED Medications  dexamethasone (DECADRON) injection 10 mg (10 mg Intramuscular Given 04/22/20 1607)    ED Course  I have reviewed the triage vital signs and the nursing notes.  Pertinent labs & imaging results that were available during my care of the patient were reviewed by me and considered in my medical decision making (see chart for details).    MDM Rules/Calculators/A&P                      Patient presenting for evaluation of 3 tender lesions of the right scalp.  She also has tenderness along the right cervical and supraclavicular lymph node chains, though no swelling.  No recent infectious symptoms including fever, cough, nasal congestion, sore throat.  Exam is not consistent with meningitis.  Likely reactive swelling, considering symptoms began 2 days ago, less likely cancer. Discussed symptomatic treatment with anti-inflammatories. Will give decadron in the ED. Encouraged close follow-up with primary care.  Offered imaging, however as this is most  likely reactive at this time, discussed with patient that this does not need to be done emergently and can be performed outpatient if lymph nodes remain swollen or tender.  Patient states she does not want the CT today, and I agree with this plan.  At this time, patient appears safe for discharge.  Return precautions given.  Patient states she understands and agrees to plan.  Final Clinical Impression(s) / ED Diagnoses Final diagnoses:  Lymphadenopathy    Rx / DC Orders ED Discharge Orders         Ordered    naproxen (NAPROSYN) 500 MG tablet  2 times daily with meals     04/22/20 [place], Deneise Getty, [name] 04/22/20 [place], [place], DO 04/22/20 1754

## 2020-04-22 NOTE — ED Notes (Signed)
Patient is being discharged from the Urgent Care and sent to the Emergency Department via private vehicle . Per K. Harris, NP, patient is in need of higher level of care due to need for scan for lymphadenopathy. Patient is aware and verbalizes understanding of plan of care.  Vitals:   04/22/20 1321  BP: 126/89  Pulse: 90  Resp: 16  Temp: 98 F (36.7 C)  SpO2: 96%

## 2020-04-25 ENCOUNTER — Other Ambulatory Visit: Payer: Self-pay | Admitting: Family Medicine

## 2020-04-25 DIAGNOSIS — R590 Localized enlarged lymph nodes: Secondary | ICD-10-CM

## 2020-07-09 ENCOUNTER — Other Ambulatory Visit: Payer: Self-pay | Admitting: Gastroenterology

## 2020-07-09 ENCOUNTER — Other Ambulatory Visit (HOSPITAL_COMMUNITY): Payer: Self-pay | Admitting: Gastroenterology

## 2020-07-09 DIAGNOSIS — R1031 Right lower quadrant pain: Secondary | ICD-10-CM

## 2020-07-10 ENCOUNTER — Ambulatory Visit (HOSPITAL_COMMUNITY)
Admission: RE | Admit: 2020-07-10 | Discharge: 2020-07-10 | Disposition: A | Discharge status: Home / Self Care | Payer: 59 | Source: Ambulatory Visit | Attending: Gastroenterology | Admitting: Gastroenterology

## 2020-07-10 ENCOUNTER — Other Ambulatory Visit: Payer: Self-pay

## 2020-07-10 DIAGNOSIS — R1031 Right lower quadrant pain: Secondary | ICD-10-CM | POA: Diagnosis present

## 2020-07-10 MED ORDER — IOHEXOL 300 MG/ML  SOLN
100.0000 mL | Freq: Once | INTRAMUSCULAR | Status: AC | PRN
  Administered 2020-07-10: 100 mL via INTRAVENOUS

## 2021-02-14 ENCOUNTER — Other Ambulatory Visit: Payer: Self-pay | Admitting: Family Medicine

## 2021-02-14 DIAGNOSIS — Z1231 Encounter for screening mammogram for malignant neoplasm of breast: Secondary | ICD-10-CM

## 2021-04-08 ENCOUNTER — Ambulatory Visit
Admission: RE | Admit: 2021-04-08 | Discharge: 2021-04-08 | Disposition: A | Discharge status: Home / Self Care | Payer: 59 | Source: Ambulatory Visit | Attending: Family Medicine | Admitting: Family Medicine

## 2021-04-08 ENCOUNTER — Other Ambulatory Visit: Payer: Self-pay

## 2021-04-08 DIAGNOSIS — Z1231 Encounter for screening mammogram for malignant neoplasm of breast: Secondary | ICD-10-CM

## 2022-07-16 ENCOUNTER — Other Ambulatory Visit: Payer: Self-pay | Admitting: Family Medicine

## 2022-07-16 DIAGNOSIS — R42 Dizziness and giddiness: Secondary | ICD-10-CM

## 2022-07-16 DIAGNOSIS — R269 Unspecified abnormalities of gait and mobility: Secondary | ICD-10-CM

## 2022-07-21 ENCOUNTER — Other Ambulatory Visit: Payer: 59

## 2022-07-26 ENCOUNTER — Other Ambulatory Visit: Payer: 59

## 2022-07-26 ENCOUNTER — Ambulatory Visit
Admission: RE | Admit: 2022-07-26 | Discharge: 2022-07-26 | Disposition: A | Discharge status: Home / Self Care | Payer: 59 | Source: Ambulatory Visit | Attending: Family Medicine | Admitting: Family Medicine

## 2022-07-26 DIAGNOSIS — R42 Dizziness and giddiness: Secondary | ICD-10-CM

## 2022-07-26 DIAGNOSIS — R269 Unspecified abnormalities of gait and mobility: Secondary | ICD-10-CM

## 2022-07-26 MED ORDER — GADOPICLENOL 0.5 MMOL/ML IV SOLN
7.0000 mL | Freq: Once | INTRAVENOUS | Status: AC | PRN
  Administered 2022-07-26: 7 mL via INTRAVENOUS

## 2023-09-20 ENCOUNTER — Other Ambulatory Visit: Payer: Self-pay | Admitting: Family Medicine

## 2023-09-20 DIAGNOSIS — Z1231 Encounter for screening mammogram for malignant neoplasm of breast: Secondary | ICD-10-CM

## 2023-12-06 ENCOUNTER — Ambulatory Visit: Payer: 59 | Admitting: Neurology
